# Patient Record
Sex: Female | Born: 1985 | Race: Black or African American | Hispanic: No | Marital: Single | Smoking: Former smoker
Health system: Southern US, Community
[De-identification: ages and names within clinical notes are randomized; demographics above are authoritative.]

## PROBLEM LIST (undated history)

## (undated) ENCOUNTER — Inpatient Hospital Stay (HOSPITAL_COMMUNITY): Payer: Self-pay

## (undated) DIAGNOSIS — L0291 Cutaneous abscess, unspecified: Secondary | ICD-10-CM

## (undated) DIAGNOSIS — D649 Anemia, unspecified: Secondary | ICD-10-CM

## (undated) DIAGNOSIS — O24419 Gestational diabetes mellitus in pregnancy, unspecified control: Secondary | ICD-10-CM

## (undated) DIAGNOSIS — I10 Essential (primary) hypertension: Secondary | ICD-10-CM

## (undated) HISTORY — DX: Gestational diabetes mellitus in pregnancy, unspecified control: O24.419

## (undated) HISTORY — DX: Essential (primary) hypertension: I10

## (undated) HISTORY — PX: CHOLECYSTECTOMY: SHX55

## (undated) HISTORY — DX: Anemia, unspecified: D64.9

## (undated) HISTORY — PX: NO PAST SURGERIES: SHX2092

---

## 2007-11-23 ENCOUNTER — Emergency Department (HOSPITAL_COMMUNITY): Admission: EM | Admit: 2007-11-23 | Discharge: 2007-11-23 | Payer: Self-pay | Admitting: Emergency Medicine

## 2009-11-16 ENCOUNTER — Ambulatory Visit (HOSPITAL_COMMUNITY): Admission: RE | Admit: 2009-11-16 | Discharge: 2009-11-16 | Payer: Self-pay | Admitting: Family Medicine

## 2009-12-09 ENCOUNTER — Ambulatory Visit (HOSPITAL_COMMUNITY): Admission: RE | Admit: 2009-12-09 | Discharge: 2009-12-09 | Payer: Self-pay | Admitting: Family Medicine

## 2010-03-14 ENCOUNTER — Ambulatory Visit (HOSPITAL_COMMUNITY): Admission: RE | Admit: 2010-03-14 | Discharge: 2010-03-14 | Payer: Self-pay | Admitting: Family Medicine

## 2010-03-25 ENCOUNTER — Inpatient Hospital Stay (HOSPITAL_COMMUNITY): Admission: AD | Admit: 2010-03-25 | Discharge: 2010-03-25 | Payer: Self-pay | Admitting: Obstetrics and Gynecology

## 2010-03-27 ENCOUNTER — Inpatient Hospital Stay (HOSPITAL_COMMUNITY): Admission: AD | Admit: 2010-03-27 | Discharge: 2010-03-29 | Payer: Self-pay | Admitting: Obstetrics and Gynecology

## 2010-03-27 ENCOUNTER — Ambulatory Visit: Payer: Self-pay | Admitting: Obstetrics and Gynecology

## 2010-08-23 ENCOUNTER — Emergency Department (HOSPITAL_COMMUNITY)
Admission: EM | Admit: 2010-08-23 | Discharge: 2010-08-24 | Disposition: A | Discharge status: Home / Self Care | Payer: Self-pay | Attending: Emergency Medicine | Admitting: Emergency Medicine

## 2010-08-23 DIAGNOSIS — R42 Dizziness and giddiness: Secondary | ICD-10-CM | POA: Insufficient documentation

## 2010-08-23 DIAGNOSIS — D649 Anemia, unspecified: Secondary | ICD-10-CM | POA: Insufficient documentation

## 2010-08-23 DIAGNOSIS — G5600 Carpal tunnel syndrome, unspecified upper limb: Secondary | ICD-10-CM | POA: Insufficient documentation

## 2010-08-24 LAB — URINE MICROSCOPIC-ADD ON

## 2010-08-24 LAB — POCT I-STAT, CHEM 8
Creatinine, Ser: 0.8 mg/dL (ref 0.4–1.2)
Hemoglobin: 11.6 g/dL — ABNORMAL LOW (ref 12.0–15.0)
Potassium: 3.7 mEq/L (ref 3.5–5.1)

## 2010-08-24 LAB — URINALYSIS, ROUTINE W REFLEX MICROSCOPIC
Ketones, ur: NEGATIVE mg/dL
Nitrite: NEGATIVE
Specific Gravity, Urine: 1.025 (ref 1.005–1.030)
Urobilinogen, UA: 1 mg/dL (ref 0.0–1.0)
pH: 5.5 (ref 5.0–8.0)

## 2010-08-24 LAB — POCT PREGNANCY, URINE: Preg Test, Ur: NEGATIVE

## 2010-08-25 LAB — URINE CULTURE
Colony Count: >=100000
Culture  Setup Time: 201202221038

## 2010-09-15 LAB — CBC
Hemoglobin: 10.6 g/dL — ABNORMAL LOW (ref 12.0–15.0)
MCH: 23.2 pg — ABNORMAL LOW (ref 26.0–34.0)
MCHC: 31.5 g/dL (ref 30.0–36.0)
MCV: 73.6 fL — ABNORMAL LOW (ref 78.0–100.0)

## 2011-03-29 LAB — URINALYSIS, ROUTINE W REFLEX MICROSCOPIC
Bilirubin Urine: NEGATIVE
Glucose, UA: NEGATIVE
Hgb urine dipstick: NEGATIVE
Ketones, ur: NEGATIVE
Nitrite: POSITIVE — AB
Protein, ur: NEGATIVE
Specific Gravity, Urine: 1.009
Urobilinogen, UA: 1
pH: 7.5

## 2011-03-29 LAB — DIFFERENTIAL
Basophils Absolute: 0
Basophils Relative: 0
Eosinophils Absolute: 0.1
Eosinophils Relative: 1
Lymphocytes Relative: 2 — ABNORMAL LOW
Lymphs Abs: 0.3 — ABNORMAL LOW
Monocytes Absolute: 0.8
Monocytes Relative: 7
Neutro Abs: 11.3 — ABNORMAL HIGH
Neutrophils Relative %: 91 — ABNORMAL HIGH

## 2011-03-29 LAB — URINE MICROSCOPIC-ADD ON

## 2011-03-29 LAB — POCT I-STAT, CHEM 8
Chloride: 103
Creatinine, Ser: 1.1
Glucose, Bld: 100 — ABNORMAL HIGH
HCT: 39
Hemoglobin: 13.3

## 2011-03-29 LAB — URINE CULTURE: Colony Count: >=100000

## 2011-03-29 LAB — CBC
HCT: 36
Hemoglobin: 11.4 — ABNORMAL LOW
MCHC: 31.8
MCV: 74.8 — ABNORMAL LOW
Platelets: 312
RBC: 4.82
RDW: 17.1 — ABNORMAL HIGH
WBC: 12.4 — ABNORMAL HIGH

## 2011-03-29 LAB — POCT PREGNANCY, URINE
Operator id: 151321
Preg Test, Ur: NEGATIVE

## 2011-03-29 LAB — RAPID STREP SCREEN (MED CTR MEBANE ONLY): Streptococcus, Group A Screen (Direct): NEGATIVE

## 2012-02-12 ENCOUNTER — Emergency Department (HOSPITAL_COMMUNITY)
Admission: EM | Admit: 2012-02-12 | Discharge: 2012-02-12 | Disposition: A | Discharge status: Home / Self Care | Payer: Self-pay | Attending: Emergency Medicine | Admitting: Emergency Medicine

## 2012-02-12 ENCOUNTER — Encounter (HOSPITAL_COMMUNITY): Payer: Self-pay | Admitting: Emergency Medicine

## 2012-02-12 DIAGNOSIS — L03317 Cellulitis of buttock: Secondary | ICD-10-CM | POA: Insufficient documentation

## 2012-02-12 DIAGNOSIS — L0231 Cutaneous abscess of buttock: Secondary | ICD-10-CM | POA: Insufficient documentation

## 2012-02-12 DIAGNOSIS — L0291 Cutaneous abscess, unspecified: Secondary | ICD-10-CM

## 2012-02-12 MED ORDER — HYDROCODONE-ACETAMINOPHEN 5-325 MG PO TABS
2.0000 | ORAL_TABLET | Freq: Once | ORAL | Status: AC
  Administered 2012-02-12: 2 via ORAL
  Filled 2012-02-12: qty 2

## 2012-02-12 MED ORDER — DOXYCYCLINE HYCLATE 100 MG PO CAPS: 100.0000 mg | ORAL_CAPSULE | Freq: Two times a day (BID) | ORAL | Status: AC

## 2012-02-12 MED ORDER — HYDROCODONE-ACETAMINOPHEN 5-500 MG PO TABS: 1.0000 | ORAL_TABLET | Freq: Four times a day (QID) | ORAL | Status: AC | PRN

## 2012-02-12 NOTE — ED Notes (Signed)
Pt c/o possible abscess to top of buttocks x several days that is getting more severe

## 2012-02-12 NOTE — ED Provider Notes (Signed)
History  This chart was scribed for Gabriella Roots, MD by Bennett Scrape. This patient was seen in room TR05C/TR05C and the patient's care was started at 10:30AM.  CSN: [id number]  Arrival date & time 02/12/12  1021   None     Chief Complaint  Patient presents with  . Abscess  . Insect Bite     Patient is a 26 y.o. female presenting with abscess. The history is provided by the patient. No language interpreter was used.  Abscess  This is a new problem. The current episode started less than one week ago. The problem occurs continuously. The problem has been gradually worsening. The abscess is present on the left buttock. The abscess is characterized by redness, swelling and itchiness. Pertinent negatives include no fever and no vomiting.    Gabriella [name] is a 26 y.o. female who presents to the Emergency Department complaining of 2 days of a possible abscess to top of the left buttocks that is getting more severe. She denies having any modifying factors and has not tried anything at home to improve her symptoms. She reports having one prior episode of similar symptoms several years ago but denies that this is a frequent issue. She denies fever, emesis, nausea and chills as associated symptoms. She does not have a h/o chronic medical conditions. She denies smoking and alcohol use.  History reviewed. No pertinent past medical history.  History reviewed. No pertinent past surgical history.  History reviewed. No pertinent family history.  History  Substance Use Topics  . Smoking status: Never Smoker   . Smokeless tobacco: Not on file  . Alcohol Use: No    No OB history provided.  Review of Systems  Constitutional: Negative for fever and chills.  Gastrointestinal: Negative for nausea and vomiting.  Skin: Negative for rash.       Positive for abscess to buttocks    Allergies  Review of patient's allergies indicates no known allergies.  Home Medications   Current  Outpatient Rx  Name Route Sig Dispense Refill  . IBUPROFEN 200 MG PO TABS Oral Take 400 mg by mouth every 6 (six) hours as needed. For pain.    . IRON PO Oral Take 1 tablet by mouth daily.      Triage Vitals: BP 136/69  Pulse 98  Temp 98.4 F (36.9 C) (Oral)  Resp 16  SpO2 100%  Physical Exam  Nursing note and vitals reviewed. Constitutional: She is oriented to person, place, and time. She appears well-developed and well-nourished. No distress.  HENT:  Head: Normocephalic and atraumatic.  Eyes: Conjunctivae are normal.  Neck: Neck supple. No tracheal deviation present.  Cardiovascular: Normal rate.   Pulmonary/Chest: Effort normal. No respiratory distress.  Musculoskeletal: Normal range of motion.  Neurological: She is alert and oriented to person, place, and time.  Skin: Skin is warm and dry.       Erythematous area on the left buttocks, approximately 5-6 cm diameter w induration?fluctuance.   Psychiatric: She has a normal mood and affect. Her behavior is normal.    ED Course  Procedures (including critical care time)  DIAGNOSTIC STUDIES: Oxygen Saturation is 100% on room air, normal by my interpretation.    COORDINATION OF CARE: 10:58AM-Discussed treatment plan of I&D with pt at bedside and pt agreed to plan.  INCISION AND DRAINAGE PROCEDURE NOTE: Patient identification was confirmed and verbal consent was obtained. This procedure was performed by me at 12:13PM. Site: top left buttock Sterile procedures observed  Needle size:  Anesthetic used (type and amt): 2 cc of 2% xylocaine Blade size: 15 Drainage: copious amount of purulent material Complexity: Complex Packing used Site anesthetized, incision made over site, wound drained and explored loculations, rinsed with copious amounts of normal saline, wound packed with sterile gauze, covered with dry, sterile dressing.  Pt tolerated procedure well without complications.  Instructions for care discussed verbally and  pt provided with additional written instructions for homecare and f/u.      MDM  I personally performed the services described in this documentation, which was scribed in my presence. The recorded information has been reviewed and considered. Gabriella Roots, MD   vicodin po (pt does not have to drive home). Good pain relief.  Pt comfortable post I and D. Sterile dressing. Will plan recheck 2 days. As surrounding erythema, will also rx abx.    Gabriella Roots, MD 02/12/12 [id number]

## 2012-02-14 ENCOUNTER — Encounter (HOSPITAL_COMMUNITY): Payer: Self-pay | Admitting: *Deleted

## 2012-02-14 ENCOUNTER — Emergency Department (HOSPITAL_COMMUNITY)
Admission: EM | Admit: 2012-02-14 | Discharge: 2012-02-14 | Disposition: A | Discharge status: Home / Self Care | Payer: Self-pay | Attending: Emergency Medicine | Admitting: Emergency Medicine

## 2012-02-14 DIAGNOSIS — L03317 Cellulitis of buttock: Secondary | ICD-10-CM | POA: Insufficient documentation

## 2012-02-14 DIAGNOSIS — L0231 Cutaneous abscess of buttock: Secondary | ICD-10-CM

## 2012-02-14 HISTORY — DX: Cutaneous abscess, unspecified: L02.91

## 2012-02-14 MED ORDER — SULFAMETHOXAZOLE-TRIMETHOPRIM 800-160 MG PO TABS: 1.0000 | ORAL_TABLET | Freq: Two times a day (BID) | ORAL | Status: AC

## 2012-02-14 MED ORDER — HYDROCODONE-ACETAMINOPHEN 5-325 MG PO TABS
2.0000 | ORAL_TABLET | Freq: Once | ORAL | Status: AC
  Administered 2012-02-14: 2 via ORAL
  Filled 2012-02-14: qty 2

## 2012-02-14 MED ORDER — PROMETHAZINE HCL 25 MG PO TABS: 25.0000 mg | ORAL_TABLET | Freq: Four times a day (QID) | ORAL | Status: DC | PRN

## 2012-02-14 NOTE — ED Provider Notes (Signed)
History   This chart was scribed for Gabriella [name]. Oletta Lamas, MD by Charolett Bumpers . The patient was seen in room TR04C/TR04C. Patient's care was started at 1210.    CSN: [id number]  Arrival date & time 02/14/12  1026   First MD Initiated Contact with Patient 02/14/12 1210      Chief Complaint  Patient presents with  . Wound Check    (Consider location/radiation/quality/duration/timing/severity/associated sxs/prior treatment) HPI Gabriella [name] is a 26 y.o. female who presents to the Emergency Department complaining of constant, moderate abscess located on her left buttock. Pt reports that she first noticed 4 days ago, was seen here in ED and had it drained 2 days ago. Pt reports that she was placed on abx and pain medication. Pt states that she is here for packing removal. Pt reports associated drainage and redness. Pt denies any fevers, but reports some intermittent chills. Pt reports that overall, her abscess has improved with the exception of some mild discomfort that is aggravated with certain positions. Pt reports a h/o abscess 6 years ago.   Past Medical History  Diagnosis Date  . Abscess     History reviewed. No pertinent past surgical history.  History reviewed. No pertinent family history.  History  Substance Use Topics  . Smoking status: Never Smoker   . Smokeless tobacco: Not on file  . Alcohol Use: No    OB History    Grav Para Term Preterm Abortions TAB SAB Ect Mult Living                  Review of Systems  Constitutional: Positive for chills. Negative for fever.  Respiratory: Negative for shortness of breath.   Gastrointestinal: Negative for nausea and vomiting.  Skin: Positive for wound.  Neurological: Negative for weakness.  All other systems reviewed and are negative.    Allergies  Doxycycline  Home Medications   Current Outpatient Rx  Name Route Sig Dispense Refill  . DOXYCYCLINE HYCLATE 100 MG PO CAPS Oral Take 1 capsule (100 mg total)  by mouth 2 (two) times daily. 14 capsule 0  . HYDROCODONE-ACETAMINOPHEN 5-500 MG PO TABS Oral Take 1-2 tablets by mouth every 6 (six) hours as needed for pain. 20 tablet 0  . IRON PO Oral Take 1 tablet by mouth daily.    Marland Kitchen PROMETHAZINE HCL 25 MG PO TABS Oral Take 1 tablet (25 mg total) by mouth every 6 (six) hours as needed for nausea. 20 tablet 0  . SULFAMETHOXAZOLE-TRIMETHOPRIM 800-160 MG PO TABS Oral Take 1 tablet by mouth 2 (two) times daily. 20 tablet 0    BP 135/53  Pulse 93  Temp 98.2 F (36.8 C) (Oral)  Resp 18  SpO2 100%  LMP 01/10/2012  Physical Exam  Nursing note and vitals reviewed. Constitutional: She is oriented to person, place, and time. She appears well-developed and well-nourished. No distress.  HENT:  Head: Normocephalic and atraumatic.  Eyes: EOM are normal.  Neck: Neck supple. No tracheal deviation present.  Cardiovascular: Normal rate.   Pulmonary/Chest: Effort normal. No respiratory distress.  Musculoskeletal: Normal range of motion.  Neurological: She is alert and oriented to person, place, and time.  Skin: Skin is warm and dry. There is erythema.       Palm sized area of erythema and induration on left buttock. Moderate purulent drainage noted. Minimal tenderness to area.   Psychiatric: She has a normal mood and affect. Her behavior is normal.    ED Course  Wound packing Date/Time: 02/14/2012 1:12 PM Performed by: Lear Ng. Authorized by: Lear Ng Consent: Verbal consent obtained. Written consent not obtained. Risks and benefits: risks, benefits and alternatives were discussed Consent given by: patient Patient understanding: patient states understanding of the procedure being performed Patient consent: the patient's understanding of the procedure matches consent given Patient identity confirmed: verbally with patient Time out: Immediately prior to procedure a "time out" was called to verify the correct patient, procedure, equipment,  support staff and site/side marked as required. Preparation: Patient was prepped and draped in the usual sterile fashion. Local anesthesia used: no Patient sedated: no Patient tolerance: Patient tolerated the procedure well with no immediate complications. Comments: Packed with 1/4 inch gauze after pus drainage, approximately 5 cc expressed with direct pressure from previous incision   (including critical care time)  DIAGNOSTIC STUDIES: Oxygen Saturation is 100% on room air, normal by my interpretation.    COORDINATION OF CARE:  12:32-Discussed planned course of treatment with the patient, who is agreeable at this time. Discussed f/u with a surgeon for further evaluation and treatment. Will repack the wound.    12:45-Medication Orders: Hydrocodone-acetaminophen (Norco/Vicodin) 5-325 mg per tablet 2 tablet-once.   1:04-Recheck: Repacked wound on left buttock with no immediate complications.   Labs Reviewed - No data to display No results found.   1. Abscess, gluteal, left       MDM  I personally performed the services described in this documentation, which was scribed in my presence. The recorded information has been reviewed and considered.   Due to the intolerance of the doxycycline, I changed to Bactrim.  Appearance of induration, erythema with deep abscess and continues to have purulent drainage, despite it feeling better to the patient, will refer to [facility] for re-evaluation.  Referral made and pt instructed to follow up with surgery clinic.         Gabriella [name]. Amberlin Utke, MD 02/14/12 1314

## 2012-02-14 NOTE — ED Notes (Signed)
Reports having wound I&D done on Monday to left buttock, needs packing removed. Only complaint is that one of her prescriptions are causing n/v.

## 2012-02-14 NOTE — Discharge Instructions (Signed)
 Abscess An abscess (boil or furuncle) is an infected area that contains a collection of pus.  SYMPTOMS Signs and symptoms of an abscess include pain, tenderness, redness, or hardness. You may feel a moveable soft area under your skin. An abscess can occur anywhere in the body.  TREATMENT  A surgical cut (incision) may be made over your abscess to drain the pus. Gauze may be packed into the space or a drain may be looped through the abscess cavity (pocket). This provides a drain that will allow the cavity to heal from the inside outwards. The abscess may be painful for a few days, but should feel much better if it was drained.  Your abscess, if seen early, may not have localized and may not have been drained. If not, another appointment may be required if it does not get better on its own or with medications. HOME CARE INSTRUCTIONS   Only take over-the-counter or prescription medicines for pain, discomfort, or fever as directed by your caregiver.   Take your antibiotics as directed if they were prescribed. Finish them even if you start to feel better.   Keep the skin and clothes clean around your abscess.   If the abscess was drained, you will need to use gauze dressing to collect any draining pus. Dressings will typically need to be changed 3 or more times a day.   The infection may spread by skin contact with others. Avoid skin contact as much as possible.   Practice good hygiene. This includes regular hand washing, cover any draining skin lesions, and do not share personal care items.   If you participate in sports, do not share athletic equipment, towels, whirlpools, or personal care items. Shower after every practice or tournament.   If a draining area cannot be adequately covered:   Do not participate in sports.   Children should not participate in day care until the wound has healed or drainage stops.   If your caregiver has given you a follow-up appointment, it is very important  to keep that appointment. Not keeping the appointment could result in a much worse infection, chronic or permanent injury, pain, and disability. If there is any problem keeping the appointment, you must call back to this facility for assistance.  SEEK MEDICAL CARE IF:   You develop increased pain, swelling, redness, drainage, or bleeding in the wound site.   You develop signs of generalized infection including muscle aches, chills, fever, or a general ill feeling.   You have an oral temperature above 102 F (38.9 C).  MAKE SURE YOU:   Understand these instructions.   Will watch your condition.   Will get help right away if you are not doing well or get worse.  Document Released: 03/29/2005 Document Revised: 06/08/2011 Document Reviewed: 01/21/2008 [facility] Patient Information 2012 [place], [place].     I recommend that you follow up in urgent surgical clinic associated with [facility] for re-evaluation of abscess.  It may require further surgical intervention for proper healing.

## 2012-02-16 ENCOUNTER — Ambulatory Visit (INDEPENDENT_AMBULATORY_CARE_PROVIDER_SITE_OTHER): Payer: Self-pay | Admitting: General Surgery

## 2012-02-16 ENCOUNTER — Encounter (INDEPENDENT_AMBULATORY_CARE_PROVIDER_SITE_OTHER): Payer: Self-pay | Admitting: General Surgery

## 2012-02-16 VITALS — BP 122/86 | HR 84 | Temp 97.4°F | Ht 67.0 in | Wt 281.2 lb

## 2012-02-16 DIAGNOSIS — L03317 Cellulitis of buttock: Secondary | ICD-10-CM

## 2012-02-16 DIAGNOSIS — L0231 Cutaneous abscess of buttock: Secondary | ICD-10-CM

## 2012-02-16 NOTE — Progress Notes (Signed)
Subjective:     Patient ID: Gabriella [name], female   DOB: [birth year], 26 y.o.   MRN: [id number]  HPI Patient was seen in the emergency department for a left superior buttock abscess. This was incised and drained this she presents for evaluation. It is been draining. The redness is gone away. It is feeling better. She is taking Bactrim.  Review of Systems     Objective:   Physical Exam  Cardiovascular: Normal rate.   Pulmonary/Chest: Effort normal. No respiratory distress. She has no wheezes.  Left superior buttock has a 1 cm incision drainage site. There is resolving induration. There is some purulent drainage remaining. The wound was cleaned out thoroughly. No undrained areas of pus are noted. Cellulitis appears to be significantly improved. Iodoform packing was placed.     Assessment:    Left buttock abscess    Plan:     Continue antibiotics, remove packing Sunday, return to urgent office 3 days

## 2012-02-19 ENCOUNTER — Encounter (INDEPENDENT_AMBULATORY_CARE_PROVIDER_SITE_OTHER): Payer: Self-pay | Admitting: Surgery

## 2012-02-19 ENCOUNTER — Ambulatory Visit (INDEPENDENT_AMBULATORY_CARE_PROVIDER_SITE_OTHER): Payer: Self-pay | Admitting: Surgery

## 2012-02-19 VITALS — BP 110/70 | HR 72 | Temp 98.5°F | Resp 18 | Ht 69.0 in | Wt 288.0 lb

## 2012-02-19 DIAGNOSIS — L0231 Cutaneous abscess of buttock: Secondary | ICD-10-CM | POA: Insufficient documentation

## 2012-02-19 NOTE — Progress Notes (Signed)
[facility]  Ovidio Kin, MD,  FACS [address] [place]  [address] [place], [place]    [address] Phone:  [phone] FAX:  [phone]   Re:   Gabriella [name] DOB:   [birth year] MRN:   [id number]  ASSESSMENT AND PLAN: 1.  Left buttocks abscess  I expanded the wound today.  To continue local wound care.  Finish Septra - about 4 more days  Return to see Dr. Janee Morn in 1 to 2 weeks.  I wrote her a note to return to work 02/21/2012.  2.  Morbid obesity.  HISTORY OF PRESENT ILLNESS: Chief Complaint  Patient presents with  . Wound Check    Gabriella [name] is a 26 y.o. (DOB: Nov 23, 1985)  AA female who is a patient of Provider Not In System and comes to me today for follow up of a left buttocks abscess.  She had this left buttocks abscess drained last week at the ER.  She went back a second time and they referred her to our office.  She was seen by Dr. Leonard Schwartz. Thompson on 02/16/2012.  He asked her to be seen back today.  She is doing well and having no pain.  PHYSICAL EXAM: BP 110/70  Pulse 72  Temp 98.5 F (36.9 C) (Oral)  Resp 18  Ht 5\' 9"  (1.753 m)  Wt 288 lb (130.636 kg)  BMI 42.53 kg/m2  LMP 01/10/2012  Buttocks:  Right buttocks wound with a 1 cm opening.  But when I probe this, the Q tip goes about 6 to 7 cm deep.  I think the abscess cavity is inadequately drain.  Procedure:  Wound painted with betadine, infiltrated with 1 % xylocaine, incision of 3 cm.  The wound is much better opened.  I packed it with gauze.  DATA REVIEWED: Old chart.  Ovidio Kin, MD, FACS Office:  [phone]

## 2012-02-29 ENCOUNTER — Encounter (INDEPENDENT_AMBULATORY_CARE_PROVIDER_SITE_OTHER): Payer: Self-pay | Admitting: General Surgery

## 2012-03-11 ENCOUNTER — Encounter (INDEPENDENT_AMBULATORY_CARE_PROVIDER_SITE_OTHER): Payer: Self-pay

## 2012-03-11 ENCOUNTER — Telehealth (INDEPENDENT_AMBULATORY_CARE_PROVIDER_SITE_OTHER): Payer: Self-pay

## 2012-03-11 NOTE — Telephone Encounter (Signed)
I left the pt a message to let her know her note is ready for work.  I will put it up front for pick up.

## 2012-03-11 NOTE — Telephone Encounter (Signed)
Message copied by Ivory Broad on Mon Mar 11, 2012  2:26 PM ------      Message from: Cathi Roan      Created: Mon Mar 11, 2012  1:51 PM      Regarding: Note for work      Contact: [phone]       Needs note for work stating that she was seen and under the care of Dr. Janee Morn

## 2015-09-07 ENCOUNTER — Encounter (HOSPITAL_COMMUNITY): Payer: Self-pay

## 2015-09-07 ENCOUNTER — Emergency Department (HOSPITAL_COMMUNITY): Payer: Self-pay

## 2015-09-07 ENCOUNTER — Emergency Department (HOSPITAL_COMMUNITY)
Admission: EM | Admit: 2015-09-07 | Discharge: 2015-09-07 | Disposition: A | Discharge status: Home / Self Care | Payer: Self-pay | Attending: Emergency Medicine | Admitting: Emergency Medicine

## 2015-09-07 DIAGNOSIS — Z79899 Other long term (current) drug therapy: Secondary | ICD-10-CM | POA: Insufficient documentation

## 2015-09-07 DIAGNOSIS — Z3202 Encounter for pregnancy test, result negative: Secondary | ICD-10-CM | POA: Insufficient documentation

## 2015-09-07 DIAGNOSIS — N390 Urinary tract infection, site not specified: Secondary | ICD-10-CM

## 2015-09-07 DIAGNOSIS — R55 Syncope and collapse: Secondary | ICD-10-CM | POA: Insufficient documentation

## 2015-09-07 DIAGNOSIS — A599 Trichomoniasis, unspecified: Secondary | ICD-10-CM

## 2015-09-07 DIAGNOSIS — Z792 Long term (current) use of antibiotics: Secondary | ICD-10-CM | POA: Insufficient documentation

## 2015-09-07 DIAGNOSIS — A5901 Trichomonal vulvovaginitis: Secondary | ICD-10-CM | POA: Insufficient documentation

## 2015-09-07 DIAGNOSIS — B9689 Other specified bacterial agents as the cause of diseases classified elsewhere: Secondary | ICD-10-CM

## 2015-09-07 DIAGNOSIS — N760 Acute vaginitis: Secondary | ICD-10-CM | POA: Insufficient documentation

## 2015-09-07 DIAGNOSIS — D649 Anemia, unspecified: Secondary | ICD-10-CM | POA: Insufficient documentation

## 2015-09-07 LAB — CBC
HEMATOCRIT: 34.7 % — AB (ref 36.0–46.0)
HEMOGLOBIN: 10.3 g/dL — AB (ref 12.0–15.0)
MCH: 21.5 pg — AB (ref 26.0–34.0)
MCHC: 29.7 g/dL — AB (ref 30.0–36.0)
MCV: 72.6 fL — ABNORMAL LOW (ref 78.0–100.0)
Platelets: 337 10*3/uL (ref 150–400)
RBC: 4.78 MIL/uL (ref 3.87–5.11)
RDW: 17.6 % — ABNORMAL HIGH (ref 11.5–15.5)
WBC: 7.3 10*3/uL (ref 4.0–10.5)

## 2015-09-07 LAB — URINE MICROSCOPIC-ADD ON

## 2015-09-07 LAB — WET PREP, GENITAL
Sperm: NONE SEEN
Yeast Wet Prep HPF POC: NONE SEEN

## 2015-09-07 LAB — BASIC METABOLIC PANEL
ANION GAP: 10 (ref 5–15)
BUN: 10 mg/dL (ref 6–20)
CO2: 27 mmol/L (ref 22–32)
Calcium: 9.1 mg/dL (ref 8.9–10.3)
Chloride: 101 mmol/L (ref 101–111)
Creatinine, Ser: 0.79 mg/dL (ref 0.44–1.00)
GFR calc Af Amer: >60 mL/min (ref >60)
GFR calc non Af Amer: >60 mL/min (ref >60)
GLUCOSE: 105 mg/dL — AB (ref 65–99)
POTASSIUM: 3.5 mmol/L (ref 3.5–5.1)
Sodium: 138 mmol/L (ref 135–145)

## 2015-09-07 LAB — URINALYSIS, ROUTINE W REFLEX MICROSCOPIC
BILIRUBIN URINE: NEGATIVE
Glucose, UA: NEGATIVE mg/dL
Ketones, ur: NEGATIVE mg/dL
NITRITE: NEGATIVE
PH: 6 (ref 5.0–8.0)
Protein, ur: NEGATIVE mg/dL
SPECIFIC GRAVITY, URINE: 1.011 (ref 1.005–1.030)

## 2015-09-07 LAB — I-STAT BETA HCG BLOOD, ED (MC, WL, AP ONLY): I-stat hCG, quantitative: <5 m[IU]/mL (ref <5)

## 2015-09-07 MED ORDER — PROMETHAZINE HCL 25 MG PO TABS
25.0000 mg | ORAL_TABLET | Freq: Four times a day (QID) | ORAL | Status: DC | PRN
Start: 2015-09-07 — End: 2015-12-27

## 2015-09-07 MED ORDER — IBUPROFEN 200 MG PO TABS
400.0000 mg | ORAL_TABLET | Freq: Once | ORAL | Status: AC
  Administered 2015-09-07: 400 mg via ORAL
  Filled 2015-09-07: qty 2

## 2015-09-07 MED ORDER — CEPHALEXIN 500 MG PO CAPS: 500.0000 mg | ORAL_CAPSULE | Freq: Two times a day (BID) | ORAL | Status: DC

## 2015-09-07 MED ORDER — METRONIDAZOLE 500 MG PO TABS: 500.0000 mg | ORAL_TABLET | Freq: Two times a day (BID) | ORAL | Status: DC

## 2015-09-07 NOTE — ED Notes (Signed)
Patient transported to X-ray 

## 2015-09-07 NOTE — ED Notes (Signed)
Discharge instructions, follow up care, and rx x3 reviewed with patient. Patient verbalized understanding. 

## 2015-09-07 NOTE — ED Notes (Signed)
Bed: WA09 Expected date:  Expected time:  Means of arrival:  Comments: Hold for hall B per MD

## 2015-09-07 NOTE — ED Notes (Signed)
Pt ambulated to XRay

## 2015-09-07 NOTE — ED Provider Notes (Signed)
CSN: [id number]     Arrival date & time 09/07/15  1321 History   First MD Initiated Contact with Patient 09/07/15 2129     Chief Complaint  Patient presents with  . Near Syncope      Patient is a 30 y.o. female presenting with near-syncope. The history is provided by the patient. No language interpreter was used.  Near Syncope   Gabriella [name] is a 30 y.o. female who presents to the Emergency Department complaining of near syncope.  She has a history of anemia due to heavy cycles and takes over-the-counter iron. Today she woke up with body aches, nausea, malaise. She has a little congestion. She reports subjective fever today. No sore throat, chest pain, vomiting, diarrhea. Symptoms are moderate and constant nature. She does have some vaginal discharge.    Past Medical History  Diagnosis Date  . Abscess   . Anemia    History reviewed. No pertinent past surgical history. History reviewed. No pertinent family history. Social History  Substance Use Topics  . Smoking status: Never Smoker   . Smokeless tobacco: None  . Alcohol Use: No   OB History    No data available     Review of Systems  Cardiovascular: Positive for near-syncope.  All other systems reviewed and are negative.     Allergies  Doxycycline  Home Medications   Prior to Admission medications   Medication Sig Start Date End Date Taking? Authorizing Provider  ibuprofen (ADVIL,MOTRIN) 200 MG tablet Take 400 mg by mouth every 6 (six) hours as needed for moderate pain.   Yes Historical Provider, MD  IRON PO Take 1 tablet by mouth daily.   Yes Historical Provider, MD  cephALEXin (KEFLEX) 500 MG capsule Take 1 capsule (500 mg total) by mouth 2 (two) times daily. 09/07/15   Tilden Fossa, MD  metroNIDAZOLE (FLAGYL) 500 MG tablet Take 1 tablet (500 mg total) by mouth 2 (two) times daily. 09/07/15   Tilden Fossa, MD  promethazine (PHENERGAN) 25 MG tablet Take 1 tablet (25 mg total) by mouth every 6 (six) hours as  needed for nausea or vomiting. 09/07/15   Tilden Fossa, MD   BP 122/58 mmHg  Pulse 87  Temp(Src) 100.3 F (37.9 C) (Oral)  Resp 12  SpO2 100%  LMP 09/04/2015 Physical Exam  Constitutional: She is oriented to person, place, and time. She appears well-developed and well-nourished.  HENT:  Head: Normocephalic and atraumatic.  Cardiovascular: Regular rhythm.   No murmur heard. Tachycardic  Pulmonary/Chest: Effort normal and breath sounds normal. No respiratory distress.  Abdominal: Soft. There is no tenderness. There is no rebound and no guarding.  Genitourinary:  Moderate yellow vaginal discharge with cervical friability.  No CMT or adnexal tenderness.    Musculoskeletal: She exhibits no edema or tenderness.  Neurological: She is alert and oriented to person, place, and time.  Skin: Skin is warm and dry.  Psychiatric: She has a normal mood and affect. Her behavior is normal.  Nursing note and vitals reviewed.   ED Course  Procedures (including critical care time) Labs Review Labs Reviewed  WET PREP, GENITAL - Abnormal; Notable for the following:    Trich, Wet Prep PRESENT (*)    Clue Cells Wet Prep HPF POC PRESENT (*)    WBC, Wet Prep HPF POC MANY (*)    All other components within normal limits  BASIC METABOLIC PANEL - Abnormal; Notable for the following:    Glucose, Bld 105 (*)  All other components within normal limits  CBC - Abnormal; Notable for the following:    Hemoglobin 10.3 (*)    HCT 34.7 (*)    MCV 72.6 (*)    MCH 21.5 (*)    MCHC 29.7 (*)    RDW 17.6 (*)    All other components within normal limits  URINALYSIS, ROUTINE W REFLEX MICROSCOPIC (NOT AT [facility]) - Abnormal; Notable for the following:    APPearance CLOUDY (*)    Hgb urine dipstick MODERATE (*)    Leukocytes, UA MODERATE (*)    All other components within normal limits  URINE MICROSCOPIC-ADD ON - Abnormal; Notable for the following:    Squamous Epithelial / LPF 0-5 (*)    Bacteria, UA FEW (*)     All other components within normal limits  CBG MONITORING, ED  I-STAT BETA HCG BLOOD, ED ([facility], [facility], [facility] ONLY)  GC/CHLAMYDIA PROBE AMP ([place]) NOT AT [facility]    Imaging Review Dg Chest 2 View  09/07/2015  CLINICAL DATA:  Cough and fever EXAM: CHEST  2 VIEW COMPARISON:  None. FINDINGS: The heart size and mediastinal contours are within normal limits. Both lungs are clear. The visualized skeletal structures are unremarkable. IMPRESSION: No active cardiopulmonary disease. Electronically Signed   By: Marlan Palauharles  Clark M.D.   On: 09/07/2015 22:11   I have personally reviewed and evaluated these images and lab results as part of my medical decision-making.   EKG Interpretation   Date/Time:  Tuesday September 07 2015 14:01:34 EST Ventricular Rate:  110 PR Interval:  164 QRS Duration: 91 QT Interval:  318 QTC Calculation: 430 R Axis:   46 Text Interpretation:  Sinus tachycardia No significant change since last  tracing Confirmed by [facility] MD, Barbara CowerJASON [phone]([id number]) on 09/07/2015 2:06:50 PM      MDM   Final diagnoses:  Trichomonas vaginalis infection  Acute UTI (urinary tract infection)  BV (bacterial vaginosis)    Patient here for evaluation of body aches, near-syncope, nausea, dysuria, vaginal discharge. He is concerning for UTI, pelvic exam with vaginal discharge but no evidence of PID. Will treat for UTI, BV, Trichomonas. Discussed wound care with oral fluid hydration, outpatient follow-up, return precautions.    Tilden FossaElizabeth Taber Sweetser, MD 09/07/15 [phone]

## 2015-09-07 NOTE — Discharge Instructions (Signed)
Bacterial Vaginosis °Bacterial vaginosis is a vaginal infection that occurs when the normal balance of bacteria in the vagina is disrupted. It results from an overgrowth of certain bacteria. This is the most common vaginal infection in women of childbearing age. Treatment is important to prevent complications, especially in pregnant women, as it can cause a premature delivery. °CAUSES  °Bacterial vaginosis is caused by an increase in harmful bacteria that are normally present in smaller amounts in the vagina. Several different kinds of bacteria can cause bacterial vaginosis. However, the reason that the condition develops is not fully understood. °RISK FACTORS °Certain activities or behaviors can put you at an increased risk of developing bacterial vaginosis, including: °· Having a new sex partner or multiple sex partners. °· Douching. °· Using an intrauterine device (IUD) for contraception. °Women do not get bacterial vaginosis from toilet seats, bedding, swimming pools, or contact with objects around them. °SIGNS AND SYMPTOMS  °Some women with bacterial vaginosis have no signs or symptoms. Common symptoms include: °· Grey vaginal discharge. °· A fishlike odor with discharge, especially after sexual intercourse. °· Itching or burning of the vagina and vulva. °· Burning or pain with urination. °DIAGNOSIS  °Your health care provider will take a medical history and examine the vagina for signs of bacterial vaginosis. A sample of vaginal fluid may be taken. Your health care provider will look at this sample under a microscope to check for bacteria and abnormal cells. A vaginal pH test may also be done.  °TREATMENT  °Bacterial vaginosis may be treated with antibiotic medicines. These may be given in the form of a pill or a vaginal cream. A second round of antibiotics may be prescribed if the condition comes back after treatment. Because bacterial vaginosis increases your risk for sexually transmitted diseases, getting  treated can help reduce your risk for chlamydia, gonorrhea, HIV, and herpes. °HOME CARE INSTRUCTIONS  °· Only take over-the-counter or prescription medicines as directed by your health care provider. °· If antibiotic medicine was prescribed, take it as directed. Make sure you finish it even if you start to feel better. °· Tell all sexual partners that you have a vaginal infection. They should see their health care provider and be treated if they have problems, such as a mild rash or itching. °· During treatment, it is important that you follow these instructions: °· Avoid sexual activity or use condoms correctly. °· Do not douche. °· Avoid alcohol as directed by your health care provider. °· Avoid breastfeeding as directed by your health care provider. °SEEK MEDICAL CARE IF:  °· Your symptoms are not improving after 3 days of treatment. °· You have increased discharge or pain. °· You have a fever. °MAKE SURE YOU:  °· Understand these instructions. °· Will watch your condition. °· Will get help right away if you are not doing well or get worse. °FOR MORE INFORMATION  °Centers for Disease Control and Prevention, Division of STD Prevention: www.cdc.gov/std °American Sexual Health Association (ASHA): www.ashastd.org  °  °This information is not intended to replace advice given to you by your health care provider. Make sure you discuss any questions you have with your health care provider. °  °Document Released: 06/19/2005 Document Revised: 07/10/2014 Document Reviewed: 01/29/2013 °Elsevier Interactive Patient Education ©2016 Elsevier Inc. ° °Trichomoniasis °Trichomoniasis is an infection caused by an organism called Trichomonas. The infection can affect both women and men. In women, the outer female genitalia and the vagina are affected. In men, the penis is mainly   mainly affected, but the prostate and other reproductive organs can also be involved. Trichomoniasis is a sexually transmitted infection (STI) and is most often  passed to another person through sexual contact.  RISK FACTORS  Having unprotected sexual intercourse.  Having sexual intercourse with an infected partner. SIGNS AND SYMPTOMS  Symptoms of trichomoniasis in women include:  Abnormal gray-green frothy vaginal discharge.  Itching and irritation of the vagina.  Itching and irritation of the area outside the vagina. Symptoms of trichomoniasis in men include:   Penile discharge with or without pain.  Pain during urination. This results from inflammation of the urethra. DIAGNOSIS  Trichomoniasis may be found during a Pap test or physical exam. Your health care provider may use one of the following methods to help diagnose this infection:  Testing the pH of the vagina with a test tape.  Using a vaginal swab test that checks for the Trichomonas organism. A test is available that provides results within a few minutes.  Examining a urine sample.  Testing vaginal secretions. Your health care provider may test you for other STIs, including HIV. TREATMENT   You may be given medicine to fight the infection. Women should inform their health care provider if they could be or are pregnant. Some medicines used to treat the infection should not be taken during pregnancy.  Your health care provider may recommend over-the-counter medicines or creams to decrease itching or irritation.  Your sexual partner will need to be treated if infected.  Your health care provider may test you for infection again 3 months after treatment. HOME CARE INSTRUCTIONS   Take medicines only as directed by your health care provider.  Take over-the-counter medicine for itching or irritation as directed by your health care provider.  Do not have sexual intercourse while you have the infection.  Women should not douche or wear tampons while they have the infection.  Discuss your infection with your partner. Your partner may have gotten the infection from you, or you  may have gotten it from your partner.  Have your sex partner get examined and treated if necessary.  Practice safe, informed, and protected sex.  See your health care provider for other STI testing. SEEK MEDICAL CARE IF:   You still have symptoms after you finish your medicine.  You develop abdominal pain.  You have pain when you urinate.  You have bleeding after sexual intercourse.  You develop a rash.  Your medicine makes you sick or makes you throw up (vomit). MAKE SURE YOU:  Understand these instructions.  Will watch your condition.  Will get help right away if you are not doing well or get worse.   This information is not intended to replace advice given to you by your health care provider. Make sure you discuss any questions you have with your health care provider.   Document Released: 12/13/2000 Document Revised: 07/10/2014 Document Reviewed: 03/31/2013 Elsevier Interactive Patient Education 2016 [employer] Inc.  Urinary Tract Infection Urinary tract infections (UTIs) can develop anywhere along your urinary tract. Your urinary tract is your body's drainage system for removing wastes and extra water. Your urinary tract includes two kidneys, two ureters, a bladder, and a urethra. Your kidneys are a pair of bean-shaped organs. Each kidney is about the size of your fist. They are located below your ribs, one on each side of your spine. CAUSES Infections are caused by microbes, which are microscopic organisms, including fungi, viruses, and bacteria. These organisms are so small that they  only be seen through a microscope. Bacteria are the microbes that most commonly cause UTIs. °SYMPTOMS  °Symptoms of UTIs may vary by age and gender of the patient and by the location of the infection. Symptoms in young women typically include a frequent and intense urge to urinate and a painful, burning feeling in the bladder or urethra during urination. Older women and men are more likely to  be tired, shaky, and weak and have muscle aches and abdominal pain. A fever may mean the infection is in your kidneys. Other symptoms of a kidney infection include pain in your back or sides below the ribs, nausea, and vomiting. °DIAGNOSIS °To diagnose a UTI, your caregiver will ask you about your symptoms. Your caregiver will also ask you to provide a urine sample. The urine sample will be tested for bacteria and white blood cells. White blood cells are made by your body to help fight infection. °TREATMENT  °Typically, UTIs can be treated with medication. Because most UTIs are caused by a bacterial infection, they usually can be treated with the use of antibiotics. The choice of antibiotic and length of treatment depend on your symptoms and the type of bacteria causing your infection. °HOME CARE INSTRUCTIONS °· If you were prescribed antibiotics, take them exactly as your caregiver instructs you. Finish the medication even if you feel better after you have only taken some of the medication. °· Drink enough water and fluids to keep your urine clear or pale yellow. °· Avoid caffeine, tea, and carbonated beverages. They tend to irritate your bladder. °· Empty your bladder often. Avoid holding urine for long periods of time. °· Empty your bladder before and after sexual intercourse. °· After a bowel movement, women should cleanse from front to back. Use each tissue only once. °SEEK MEDICAL CARE IF:  °· You have back pain. °· You develop a fever. °· Your symptoms do not begin to resolve within 3 days. °SEEK IMMEDIATE MEDICAL CARE IF:  °· You have severe back pain or lower abdominal pain. °· You develop chills. °· You have nausea or vomiting. °· You have continued burning or discomfort with urination. °MAKE SURE YOU:  °· Understand these instructions. °· Will watch your condition. °· Will get help right away if you are not doing well or get worse. °  °This information is not intended to replace advice given to you by  your health care provider. Make sure you discuss any questions you have with your health care provider. °  °Document Released: 03/29/2005 Document Revised: 03/10/2015 Document Reviewed: 07/28/2011 °Elsevier Interactive Patient Education ©2016 Elsevier Inc. ° °

## 2015-09-07 NOTE — ED Notes (Signed)
Pt has hx of anemia.  Pt has had heavy cycle for 3 days.  Felt weak and had to sit down in floor.  Pt able to get back up and get someone to bring her here.  Pt also has headache.

## 2015-09-09 LAB — GC/CHLAMYDIA PROBE AMP (~~LOC~~) NOT AT ARMC
CHLAMYDIA, DNA PROBE: NEGATIVE
NEISSERIA GONORRHEA: NEGATIVE

## 2015-12-27 ENCOUNTER — Emergency Department (HOSPITAL_COMMUNITY): Payer: Self-pay

## 2015-12-27 ENCOUNTER — Encounter (HOSPITAL_COMMUNITY): Payer: Self-pay | Admitting: Emergency Medicine

## 2015-12-27 ENCOUNTER — Emergency Department (HOSPITAL_COMMUNITY)
Admission: EM | Admit: 2015-12-27 | Discharge: 2015-12-27 | Disposition: A | Discharge status: Home / Self Care | Payer: Self-pay | Attending: Emergency Medicine | Admitting: Emergency Medicine

## 2015-12-27 DIAGNOSIS — R109 Unspecified abdominal pain: Secondary | ICD-10-CM

## 2015-12-27 DIAGNOSIS — K8050 Calculus of bile duct without cholangitis or cholecystitis without obstruction: Secondary | ICD-10-CM | POA: Insufficient documentation

## 2015-12-27 DIAGNOSIS — N390 Urinary tract infection, site not specified: Secondary | ICD-10-CM | POA: Insufficient documentation

## 2015-12-27 LAB — COMPREHENSIVE METABOLIC PANEL
ALK PHOS: 85 U/L (ref 38–126)
ALT: 16 U/L (ref 14–54)
AST: 16 U/L (ref 15–41)
Albumin: 4.3 g/dL (ref 3.5–5.0)
Anion gap: 9 (ref 5–15)
BILIRUBIN TOTAL: 1 mg/dL (ref 0.3–1.2)
BUN: 8 mg/dL (ref 6–20)
CO2: 25 mmol/L (ref 22–32)
CREATININE: 0.85 mg/dL (ref 0.44–1.00)
Calcium: 9 mg/dL (ref 8.9–10.3)
Chloride: 103 mmol/L (ref 101–111)
GFR calc Af Amer: >60 mL/min (ref >60)
Glucose, Bld: 110 mg/dL — ABNORMAL HIGH (ref 65–99)
Potassium: 3.6 mmol/L (ref 3.5–5.1)
Sodium: 137 mmol/L (ref 135–145)
TOTAL PROTEIN: 8.6 g/dL — AB (ref 6.5–8.1)

## 2015-12-27 LAB — CBC
HCT: 36.3 % (ref 36.0–46.0)
Hemoglobin: 11.1 g/dL — ABNORMAL LOW (ref 12.0–15.0)
MCH: 21.8 pg — ABNORMAL LOW (ref 26.0–34.0)
MCHC: 30.6 g/dL (ref 30.0–36.0)
MCV: 71.2 fL — ABNORMAL LOW (ref 78.0–100.0)
PLATELETS: 364 10*3/uL (ref 150–400)
RBC: 5.1 MIL/uL (ref 3.87–5.11)
RDW: 16.7 % — AB (ref 11.5–15.5)
WBC: 9.9 10*3/uL (ref 4.0–10.5)

## 2015-12-27 LAB — URINALYSIS, ROUTINE W REFLEX MICROSCOPIC
BILIRUBIN URINE: NEGATIVE
GLUCOSE, UA: NEGATIVE mg/dL
Hgb urine dipstick: NEGATIVE
KETONES UR: NEGATIVE mg/dL
NITRITE: NEGATIVE
PROTEIN: NEGATIVE mg/dL
Specific Gravity, Urine: 1.025 (ref 1.005–1.030)
pH: 5.5 (ref 5.0–8.0)

## 2015-12-27 LAB — I-STAT BETA HCG BLOOD, ED (MC, WL, AP ONLY): I-stat hCG, quantitative: <5 m[IU]/mL (ref <5)

## 2015-12-27 LAB — URINE MICROSCOPIC-ADD ON: RBC / HPF: NONE SEEN RBC/hpf (ref 0–5)

## 2015-12-27 LAB — LIPASE, BLOOD: Lipase: 23 U/L (ref 11–51)

## 2015-12-27 MED ORDER — RANITIDINE HCL 150 MG PO TABS: 150.0000 mg | ORAL_TABLET | Freq: Two times a day (BID) | ORAL | Status: DC

## 2015-12-27 MED ORDER — ONDANSETRON HCL 4 MG/2ML IJ SOLN
4.0000 mg | Freq: Once | INTRAMUSCULAR | Status: AC | PRN
  Administered 2015-12-27: 4 mg via INTRAVENOUS
  Filled 2015-12-27: qty 2

## 2015-12-27 MED ORDER — SODIUM CHLORIDE 0.9 % IV BOLUS (SEPSIS)
1000.0000 mL | Freq: Once | INTRAVENOUS | Status: AC
  Administered 2015-12-27: 1000 mL via INTRAVENOUS

## 2015-12-27 MED ORDER — DICYCLOMINE HCL 10 MG PO CAPS
10.0000 mg | ORAL_CAPSULE | Freq: Once | ORAL | Status: AC
  Administered 2015-12-27: 10 mg via ORAL
  Filled 2015-12-27: qty 1

## 2015-12-27 MED ORDER — CEPHALEXIN 500 MG PO CAPS
500.0000 mg | ORAL_CAPSULE | Freq: Once | ORAL | Status: AC
Start: 2015-12-27 — End: 2015-12-27
  Administered 2015-12-27: 500 mg via ORAL
  Filled 2015-12-27: qty 1

## 2015-12-27 MED ORDER — GI COCKTAIL ~~LOC~~
30.0000 mL | Freq: Once | ORAL | Status: AC
  Administered 2015-12-27: 30 mL via ORAL
  Filled 2015-12-27: qty 30

## 2015-12-27 MED ORDER — CEPHALEXIN 500 MG PO CAPS: 500.0000 mg | ORAL_CAPSULE | Freq: Two times a day (BID) | ORAL | Status: DC

## 2015-12-27 MED ORDER — PROMETHAZINE HCL 25 MG PO TABS: 25.0000 mg | ORAL_TABLET | Freq: Four times a day (QID) | ORAL | Status: DC | PRN

## 2015-12-27 MED ORDER — DICYCLOMINE HCL 20 MG PO TABS: 20.0000 mg | ORAL_TABLET | Freq: Two times a day (BID) | ORAL | Status: DC

## 2015-12-27 NOTE — ED Notes (Signed)
Discharge instructions, follow up care, and prescriptions reviewed with patient. Patient verbalized understanding. 

## 2015-12-27 NOTE — Discharge Instructions (Signed)
Cholelithiasis °Cholelithiasis (also called gallstones) is a form of gallbladder disease in which gallstones form in your gallbladder. The gallbladder is an organ that stores bile made in the liver, which helps digest fats. Gallstones begin as small crystals and slowly grow into stones. Gallstone pain occurs when the gallbladder spasms and a gallstone is blocking the duct. Pain can also occur when a stone passes out of the duct.  °RISK FACTORS °· Being female.   °· Having multiple pregnancies. Health care providers sometimes advise removing diseased gallbladders before future pregnancies.   °· Being obese. °· Eating a diet heavy in fried foods and fat.   °· Being older than 60 years and increasing age.   °· Prolonged use of medicines containing female hormones.   °· Having diabetes mellitus.   °· Rapidly losing weight.   °· Having a family history of gallstones (heredity).   °SYMPTOMS °· Nausea.   °· Vomiting. °· Abdominal pain.   °· Yellowing of the skin (jaundice).   °· Sudden pain. It may persist from several minutes to several hours. °· Fever.   °· Tenderness to the touch.  °In some cases, when gallstones do not move into the bile duct, people have no pain or symptoms. These are called "silent" gallstones.  °TREATMENT °Silent gallstones do not need treatment. In severe cases, emergency surgery may be required. Options for treatment include: °· Surgery to remove the gallbladder. This is the most common treatment. °· Medicines. These do not always work and may take 6-12 months or more to work. °· Shock wave treatment (extracorporeal biliary lithotripsy). In this treatment an ultrasound machine sends shock waves to the gallbladder to break gallstones into smaller pieces that can pass into the intestines or be dissolved by medicine. °HOME CARE INSTRUCTIONS  °· Only take over-the-counter or prescription medicines for pain, discomfort, or fever as directed by your health care provider.   °· Follow a low-fat diet until  seen again by your health care provider. Fat causes the gallbladder to contract, which can result in pain.   °· Follow up with your health care provider as directed. Attacks are almost always recurrent and surgery is usually required for permanent treatment.   °SEEK IMMEDIATE MEDICAL CARE IF:  °· Your pain increases and is not controlled by medicines.   °· You have a fever or persistent symptoms for more than 2-3 days.   °· You have a fever and your symptoms suddenly get worse.   °· You have persistent nausea and vomiting.   °MAKE SURE YOU:  °· Understand these instructions. °· Will watch your condition. °· Will get help right away if you are not doing well or get worse. °  °This information is not intended to replace advice given to you by your health care provider. Make sure you discuss any questions you have with your health care provider. °  °Document Released: 06/15/2005 Document Revised: 02/19/2013 Document Reviewed: 12/11/2012 °Elsevier Interactive Patient Education ©2016 Else[employer] Inc. ° °Urinary Tract Infection °Urinary tract infections (UTIs) can develop anywhere along your urinary tract. Your urinary tract is your body's drainage system for removing wastes and extra water. Your urinary tract includes two kidneys, two ureters, a bladder, and a urethra. Your kidneys are a pair of bean-shaped organs. Each kidney is about the size of your fist. They are located below your ribs, one on each side of your spine. °CAUSES °Infections are caused by microbes, which are microscopic organisms, including fungi, viruses, and bacteria. These organisms are so small that they can only be seen through a microscope. Bacteria are the microbes that most commonly cause   UTIs. °SYMPTOMS  °Symptoms of UTIs may vary by age and gender of the patient and by the location of the infection. Symptoms in young women typically include a frequent and intense urge to urinate and a painful, burning feeling in the bladder or urethra during  urination. Older women and men are more likely to be tired, shaky, and weak and have muscle aches and abdominal pain. A fever may mean the infection is in your kidneys. Other symptoms of a kidney infection include pain in your back or sides below the ribs, nausea, and vomiting. °DIAGNOSIS °To diagnose a UTI, your caregiver will ask you about your symptoms. Your caregiver will also ask you to provide a urine sample. The urine sample will be tested for bacteria and white blood cells. White blood cells are made by your body to help fight infection. °TREATMENT  °Typically, UTIs can be treated with medication. Because most UTIs are caused by a bacterial infection, they usually can be treated with the use of antibiotics. The choice of antibiotic and length of treatment depend on your symptoms and the type of bacteria causing your infection. °HOME CARE INSTRUCTIONS °· If you were prescribed antibiotics, take them exactly as your caregiver instructs you. Finish the medication even if you feel better after you have only taken some of the medication. °· Drink enough water and fluids to keep your urine clear or pale yellow. °· Avoid caffeine, tea, and carbonated beverages. They tend to irritate your bladder. °· Empty your bladder often. Avoid holding urine for long periods of time. °· Empty your bladder before and after sexual intercourse. °· After a bowel movement, women should cleanse from front to back. Use each tissue only once. °SEEK MEDICAL CARE IF:  °· You have back pain. °· You develop a fever. °· Your symptoms do not begin to resolve within 3 days. °SEEK IMMEDIATE MEDICAL CARE IF:  °· You have severe back pain or lower abdominal pain. °· You develop chills. °· You have nausea or vomiting. °· You have continued burning or discomfort with urination. °MAKE SURE YOU:  °· Understand these instructions. °· Will watch your condition. °· Will get help right away if you are not doing well or get worse. °  °This information is  not intended to replace advice given to you by your health care provider. Make sure you discuss any questions you have with your health care provider. °  °Document Released: 03/29/2005 Document Revised: 03/10/2015 Document Reviewed: 07/28/2011 °Elsevier Interactive Patient Education ©2016 Elsevier Inc. ° °

## 2015-12-27 NOTE — ED Provider Notes (Signed)
CSN: [id number]     Arrival date & time 12/27/15  [id number] History   First MD Initiated Contact with Patient 12/27/15 0802     Chief Complaint  Patient presents with  . Abdominal Pain     Patient is a 30 y.o. female presenting with abdominal pain. The history is provided by the patient. No language interpreter was used.  Abdominal Pain  Gabriella [name] is a 30 y.o. female who presents to the Emergency Department complaining of abdominal pain. She reports 1 week of right-sided abdominal pain described as a discomfort. She had significant vomiting over a week ago and now has persistent nausea, decreased appetite, reflux symptoms. She has abdominal bloating. No fevers. She was constipated a week ago but this has since resolved. No diarrhea, dysuria, discharge. Symptoms are moderate, constant, worsening.  Past Medical History  Diagnosis Date  . Abscess   . Anemia    History reviewed. No pertinent past surgical history. History reviewed. No pertinent family history. Social History  Substance Use Topics  . Smoking status: Never Smoker   . Smokeless tobacco: None  . Alcohol Use: No   OB History    No data available     Review of Systems  Gastrointestinal: Positive for abdominal pain.  All other systems reviewed and are negative.     Allergies  Doxycycline  Home Medications   Prior to Admission medications   Medication Sig Start Date End Date Taking? Authorizing Provider  cephALEXin (KEFLEX) 500 MG capsule Take 1 capsule (500 mg total) by mouth 2 (two) times daily. 12/27/15   Tilden FossaElizabeth Ovie Eastep, MD  dicyclomine (BENTYL) 20 MG tablet Take 1 tablet (20 mg total) by mouth 2 (two) times daily. 12/27/15   Tilden FossaElizabeth Jasyah Theurer, MD  ibuprofen (ADVIL,MOTRIN) 200 MG tablet Take 400 mg by mouth every 6 (six) hours as needed for moderate pain.    Historical Provider, MD  IRON PO Take 1 tablet by mouth daily.    Historical Provider, MD  metroNIDAZOLE (FLAGYL) 500 MG tablet Take 1 tablet (500 mg total)  by mouth 2 (two) times daily. 09/07/15   Tilden FossaElizabeth Aleysia Oltmann, MD  promethazine (PHENERGAN) 25 MG tablet Take 1 tablet (25 mg total) by mouth every 6 (six) hours as needed for nausea or vomiting. 12/27/15   Tilden FossaElizabeth Cobin Cadavid, MD  ranitidine (ZANTAC) 150 MG tablet Take 1 tablet (150 mg total) by mouth 2 (two) times daily. 12/27/15   Tilden FossaElizabeth Avila Albritton, MD   BP 119/56 mmHg  Pulse 75  Temp(Src) 98.4 F (36.9 C) (Oral)  Resp 18  SpO2 99%  LMP 12/12/2015 Physical Exam  Constitutional: She is oriented to person, place, and time. She appears well-developed and well-nourished.  HENT:  Head: Normocephalic and atraumatic.  Cardiovascular: Regular rhythm.   No murmur heard. Tachycardiac  Pulmonary/Chest: Effort normal and breath sounds normal. No respiratory distress.  Abdominal: Soft. There is no rebound and no guarding.  Mild mid right abdominal tenderness without guarding or rebound  Musculoskeletal: She exhibits no edema or tenderness.  Neurological: She is alert and oriented to person, place, and time.  Skin: Skin is warm and dry.  Psychiatric: She has a normal mood and affect. Her behavior is normal.  Nursing note and vitals reviewed.   ED Course  Procedures (including critical care time) Labs Review Labs Reviewed  COMPREHENSIVE METABOLIC PANEL - Abnormal; Notable for the following:    Glucose, Bld 110 (*)    Total Protein 8.6 (*)    All other components within  normal limits  CBC - Abnormal; Notable for the following:    Hemoglobin 11.1 (*)    MCV 71.2 (*)    MCH 21.8 (*)    RDW 16.7 (*)    All other components within normal limits  URINALYSIS, ROUTINE W REFLEX MICROSCOPIC (NOT [facility]) - Abnormal; Notable for the following:    Color, Urine AMBER (*)    APPearance CLOUDY (*)    Leukocytes, UA LARGE (*)    All other components within normal limits  URINE MICROSCOPIC-ADD ON - Abnormal; Notable for the following:    Squamous Epithelial / LPF 0-5 (*)    Bacteria, UA MANY (*)    All other  components within normal limits  LIPASE, BLOOD  I-STAT BETA HCG BLOOD, ED ([facility], [facility], [facility] ONLY)    Imaging Review [place]as Abdomen Limited Ruq  12/27/2015  CLINICAL DATA:  Abdominal pain for 2 weeks EXAM: US ABDOMEN LIMITED - RIGHT UPPER QUADRANT COMPARISON:  None. FINDINGS: Gallbladder: Small layering gallstones within the gallbladder, the largest 9 mm. No wall thickening. Negative sonographic Murphy's Common bile duct: Diameter: Normal caliber, 5 mm Liver: No focal lesion identified. Within normal limits in parenchymal echogenicity. IMPRESSION: Cholelithiasis.  No sonographic evidence of acute cholecystitis. Electronically Signed   By: Charlett NoseKevin  Dover M.D.   On: 12/27/2015 08:59   I have personally reviewed and evaluated these images and lab results as part of my medical decision-making.   EKG Interpretation None      MDM   Final diagnoses:  Abdominal pain  Biliary colic  Acute UTI    Pt here for evaluation of mid abdominal pain, nausea, dyspepsia, bloating and reflux sxs. Sxs are worse with meals.  Pt is nontoxic on exam with no peritoneal findings. Presentation is not c/w acute appendicitis.  US with cholelithiasis, no evidence of cholecystitis.  UA concerning for UTI - will treat with keflex. D/w pt home care for biliary colic, uti, reflux.  Discussed close outpatient follow up, home care, return precautions.     Tilden FossaElizabeth Ziyan Hillmer, MD 12/27/15 1556

## 2015-12-27 NOTE — ED Notes (Signed)
US at bedside

## 2015-12-27 NOTE — ED Notes (Signed)
Ultrasound at bedside

## 2015-12-27 NOTE — ED Notes (Addendum)
Pt c/o emesis, constipation, LLQ abdominal pain radiating into right flank. Pt reports reflux symptoms, "sour" sensation in throat, no history of reflux. No CVAT. Urinary frequency without dysuria.

## 2016-11-28 ENCOUNTER — Inpatient Hospital Stay (HOSPITAL_COMMUNITY)
Admission: AD | Admit: 2016-11-28 | Discharge: 2016-11-28 | Disposition: A | Discharge status: Home / Self Care | Payer: Self-pay | Source: Ambulatory Visit | Attending: Family Medicine | Admitting: Family Medicine

## 2016-11-28 ENCOUNTER — Encounter (HOSPITAL_COMMUNITY): Payer: Self-pay | Admitting: *Deleted

## 2016-11-28 ENCOUNTER — Inpatient Hospital Stay (HOSPITAL_COMMUNITY): Payer: Self-pay

## 2016-11-28 DIAGNOSIS — O3680X0 Pregnancy with inconclusive fetal viability, not applicable or unspecified: Secondary | ICD-10-CM

## 2016-11-28 DIAGNOSIS — Z6740 Type O blood, Rh positive: Secondary | ICD-10-CM | POA: Insufficient documentation

## 2016-11-28 DIAGNOSIS — O4690 Antepartum hemorrhage, unspecified, unspecified trimester: Secondary | ICD-10-CM

## 2016-11-28 DIAGNOSIS — Z3A01 Less than 8 weeks gestation of pregnancy: Secondary | ICD-10-CM | POA: Insufficient documentation

## 2016-11-28 DIAGNOSIS — Z6790 Unspecified blood type, Rh positive: Secondary | ICD-10-CM

## 2016-11-28 DIAGNOSIS — Z881 Allergy status to other antibiotic agents status: Secondary | ICD-10-CM | POA: Insufficient documentation

## 2016-11-28 DIAGNOSIS — O209 Hemorrhage in early pregnancy, unspecified: Secondary | ICD-10-CM | POA: Insufficient documentation

## 2016-11-28 LAB — WET PREP, GENITAL
Sperm: NONE SEEN
Trich, Wet Prep: NONE SEEN
Yeast Wet Prep HPF POC: NONE SEEN

## 2016-11-28 LAB — URINALYSIS, ROUTINE W REFLEX MICROSCOPIC
BILIRUBIN URINE: NEGATIVE
GLUCOSE, UA: NEGATIVE mg/dL
Ketones, ur: NEGATIVE mg/dL
NITRITE: NEGATIVE
Protein, ur: NEGATIVE mg/dL
SPECIFIC GRAVITY, URINE: 1.012 (ref 1.005–1.030)
pH: 5 (ref 5.0–8.0)

## 2016-11-28 LAB — HCG, QUANTITATIVE, PREGNANCY: HCG, BETA CHAIN, QUANT, S: 656 m[IU]/mL — AB (ref <5)

## 2016-11-28 LAB — CBC
HCT: 33.2 % — ABNORMAL LOW (ref 36.0–46.0)
Hemoglobin: 9.9 g/dL — ABNORMAL LOW (ref 12.0–15.0)
MCH: 21 pg — ABNORMAL LOW (ref 26.0–34.0)
MCHC: 29.8 g/dL — AB (ref 30.0–36.0)
MCV: 70.5 fL — ABNORMAL LOW (ref 78.0–100.0)
Platelets: 316 10*3/uL (ref 150–400)
RBC: 4.71 MIL/uL (ref 3.87–5.11)
RDW: 18.7 % — AB (ref 11.5–15.5)
WBC: 7.2 10*3/uL (ref 4.0–10.5)

## 2016-11-28 LAB — ABO/RH: ABO/RH(D): O POS

## 2016-11-28 LAB — POCT PREGNANCY, URINE: PREG TEST UR: NEGATIVE

## 2016-11-28 NOTE — Discharge Instructions (Signed)
Vaginal Bleeding During Pregnancy, First Trimester °A small amount of bleeding (spotting) from the vagina is relatively common in early pregnancy. It usually stops on its own. Various things may cause bleeding or spotting in early pregnancy. Some bleeding may be related to the pregnancy, and some may not. In most cases, the bleeding is normal and is not a problem. However, bleeding can also be a sign of something serious. Be sure to tell your health care provider about any vaginal bleeding right away. °Some possible causes of vaginal bleeding during the first trimester include: °· Infection or inflammation of the cervix. °· Growths (polyps) on the cervix. °· Miscarriage or threatened miscarriage. °· Pregnancy tissue has developed outside of the uterus and in a fallopian tube (tubal pregnancy). °· Tiny cysts have developed in the uterus instead of pregnancy tissue (molar pregnancy). °Follow these instructions at home: °Watch your condition for any changes. The following actions may help to lessen any discomfort you are feeling: °· Follow your health care provider's instructions for limiting your activity. If your health care provider orders bed rest, you may need to stay in bed and only get up to use the bathroom. However, your health care provider may allow you to continue light activity. °· If needed, make plans for someone to help with your regular activities and responsibilities while you are on bed rest. °· Keep track of the number of pads you use each day, how often you change pads, and how soaked (saturated) they are. Write this down. °· Do not use tampons. Do not douche. °· Do not have sexual intercourse or orgasms until approved by your health care provider. °· If you pass any tissue from your vagina, save the tissue so you can show it to your health care provider. °· Only take over-the-counter or prescription medicines as directed by your health care provider. °· Do not take aspirin because it can make you  bleed. °· Keep all follow-up appointments as directed by your health care provider. °Contact a health care provider if: °· You have any vaginal bleeding during any part of your pregnancy. °· You have cramps or labor pains. °· You have a fever, not controlled by medicine. °Get help right away if: °· You have severe cramps in your back or belly (abdomen). °· You pass large clots or tissue from your vagina. °· Your bleeding increases. °· You feel light-headed or weak, or you have fainting episodes. °· You have chills. °· You are leaking fluid or have a gush of fluid from your vagina. °· You pass out while having a bowel movement. °This information is not intended to replace advice given to you by your health care provider. Make sure you discuss any questions you have with your health care provider. °Document Released: 03/29/2005 Document Revised: 11/25/2015 Document Reviewed: 02/24/2013 °Elsevier Interactive Patient Education © 2017 [employer] ° °

## 2016-11-28 NOTE — MAU Provider Note (Signed)
History     CSN: [id number]  Arrival date and time: 11/28/16 0846   First Provider Initiated Contact with Patient 11/28/16 0919      No chief complaint on file.  G2P1001 @ [redacted]w[redacted]d by LMP here with VB and and pain. Bleeding started 3 days ago. Using 3-4 pads per day. LAP started around same time. Describes as constant, rates 3-4/10. She didn't take anything for it. Seen at [facility] 4 days ago for AEX and had +UPT.    Past Medical History:  Diagnosis Date  . Abscess   . Anemia     Past Surgical History:  Procedure Laterality Date  . CHOLECYSTECTOMY      History reviewed. No pertinent family history.  Social History  Substance Use Topics  . Smoking status: Never Smoker  . Smokeless tobacco: Never Used  . Alcohol use No    Allergies:  Allergies  Allergen Reactions  . Doxycycline Nausea And Vomiting    Prescriptions Prior to Admission  Medication Sig Dispense Refill Last Dose  . cephALEXin (KEFLEX) 500 MG capsule Take 1 capsule (500 mg total) by mouth 2 (two) times daily. 14 capsule 0   . dicyclomine (BENTYL) 20 MG tablet Take 1 tablet (20 mg total) by mouth 2 (two) times daily. 20 tablet 0   . ibuprofen (ADVIL,MOTRIN) 200 MG tablet Take 400 mg by mouth every 6 (six) hours as needed for moderate pain.   Past Week at Unknown time  . IRON PO Take 1 tablet by mouth daily.   09/06/2015 at Unknown time  . metroNIDAZOLE (FLAGYL) 500 MG tablet Take 1 tablet (500 mg total) by mouth 2 (two) times daily. 14 tablet 0   . promethazine (PHENERGAN) 25 MG tablet Take 1 tablet (25 mg total) by mouth every 6 (six) hours as needed for nausea or vomiting. 8 tablet 0   . ranitidine (ZANTAC) 150 MG tablet Take 1 tablet (150 mg total) by mouth 2 (two) times daily. 30 tablet 0     Review of Systems  Constitutional: Negative for fever.  Gastrointestinal: Positive for abdominal pain.  Genitourinary: Positive for frequency and vaginal bleeding. Negative for dysuria, urgency and vaginal discharge.   Musculoskeletal: Positive for back pain.   Physical Exam   Blood pressure (!) 153/76, pulse 84, temperature 98.1 F (36.7 C), temperature source Oral, resp. rate 18, height 5\' 9"  (1.753 m), weight 132 kg (291 lb), last menstrual period 10/02/2016, SpO2 100 %.  Physical Exam  Constitutional: She is oriented to person, place, and time. She appears well-developed and well-nourished. No distress (appears comfortable).  HENT:  Head: Normocephalic and atraumatic.  Neck: Normal range of motion.  Cardiovascular: Normal rate.   Respiratory: Effort normal.  GI: Soft. She exhibits no distension and no mass. There is no tenderness. There is no rebound and no guarding.  Genitourinary:  Genitourinary Comments: External: no lesions or erythema Vagina: rugated, parous/ nulli, moderate drk bloody discharge Uterus: non enlarged, anteverted, + tender, no CMT Adnexae: no masses, no tenderness left, no tenderness right   Musculoskeletal: Normal range of motion.  Neurological: She is alert and oriented to person, place, and time.  Skin: Skin is warm and dry.  Psychiatric: She has a normal mood and affect.   Results for orders placed or performed during the hospital encounter of 11/28/16 (from the past 24 hour(s))  Urinalysis, Routine w reflex microscopic     Status: Abnormal   Collection Time: 11/28/16  9:15 AM  Result Value Ref Range  Color, Urine YELLOW YELLOW   APPearance HAZY (A) CLEAR   Specific Gravity, Urine 1.012 1.005 - 1.030   pH 5.0 5.0 - 8.0   Glucose, UA NEGATIVE NEGATIVE mg/dL   Hgb urine dipstick LARGE (A) NEGATIVE   Bilirubin Urine NEGATIVE NEGATIVE   Ketones, ur NEGATIVE NEGATIVE mg/dL   Protein, ur NEGATIVE NEGATIVE mg/dL   Nitrite NEGATIVE NEGATIVE   Leukocytes, UA SMALL (A) NEGATIVE   RBC / HPF 0-5 0 - 5 RBC/hpf   WBC, UA 0-5 0 - 5 WBC/hpf   Bacteria, UA RARE (A) NONE SEEN   Squamous Epithelial / LPF 0-5 (A) NONE SEEN   Mucous PRESENT   CBC     Status: Abnormal    Collection Time: 11/28/16  9:22 AM  Result Value Ref Range   WBC 7.2 4.0 - 10.5 K/uL   RBC 4.71 3.87 - 5.11 MIL/uL   Hemoglobin 9.9 (L) 12.0 - 15.0 g/dL   HCT 59.5 (L) 63.8 - 75.6 %   MCV 70.5 (L) 78.0 - 100.0 fL   MCH 21.0 (L) 26.0 - 34.0 pg   MCHC 29.8 (L) 30.0 - 36.0 g/dL   RDW 43.3 (H) 29.5 - 18.8 %   Platelets 316 150 - 400 K/uL  ABO/Rh     Status: None   Collection Time: 11/28/16  9:22 AM  Result Value Ref Range   ABO/RH(D) O POS   hCG, quantitative, pregnancy     Status: Abnormal   Collection Time: 11/28/16  9:22 AM  Result Value Ref Range   hCG, Beta Chain, Quant, S 656 (H) <5 mIU/mL  Pregnancy, urine POC     Status: None   Collection Time: 11/28/16  9:31 AM  Result Value Ref Range   Preg Test, Ur NEGATIVE NEGATIVE  Wet prep, genital     Status: Abnormal   Collection Time: 11/28/16  9:40 AM  Result Value Ref Range   Yeast Wet Prep HPF POC NONE SEEN NONE SEEN   Trich, Wet Prep NONE SEEN NONE SEEN   Clue Cells Wet Prep HPF POC PRESENT (A) NONE SEEN   WBC, Wet Prep HPF POC FEW (A) NONE SEEN   Sperm NONE SEEN    US Ob Comp Less 14 Wks  Result Date: 11/28/2016 CLINICAL DATA:  31 year old pregnant female presents with vaginal bleeding. Quantitative beta HCG 656. EDC by LMP: 07/26/2017, projecting to an expected gestational age of [redacted] weeks 5 days. EXAM: OBSTETRIC <14 WK [place] AND TRANSVAGINAL OB US TECHNIQUE: Both transabdominal and transvaginal ultrasound examinations were performed for complete evaluation of the gestation as well as the maternal uterus, adnexal regions, and pelvic cul-de-sac. Transvaginal technique was performed to assess early pregnancy. COMPARISON:  No prior scans from this gestation. FINDINGS: There is a single intrauterine sac-like structure with apparent double decidual sac sign, mean sac diameter of 5.6 mm, which would project to a gestational age of [redacted] weeks 2 days. No yolk sac or embryo are seen within the intrauterine sac-like structure at this time . Mildly  heterogeneous endometrium with no additional focal endometrial finding. No uterine fibroids. Right ovary measures 4.1 x 2.2 x 2.7 cm and probably contains a corpus luteum as seen on the limited transvaginal images. Left ovary is not visualized on the transabdominal or transvaginal portions of the scan. No adnexal masses are demonstrated. No free fluid in the pelvis. IMPRESSION: Pregnancy is not definitively localized on this scan. Intrauterine 5.6 mm sac-like structure with apparent double decidual sac sign, which may  represent an early intrauterine gestational sac, although no yolk sac or embryo are seen at this time for definitive diagnosis. No abnormal adnexal masses. No free fluid in the pelvis. Sonographic differential diagnosis includes early intrauterine gestation, spontaneous abortion or occult ectopic gestation. Close clinical follow-up with serial serum beta HCG monitoring recommended, with follow-up obstetric scan in 2 weeks or earlier as clinically warranted. Electronically Signed   By: Delbert PhenixJason A Poff M.D.   On: 11/28/2016 12:48   [place] Ob Transvaginal  Result Date: 11/28/2016 CLINICAL DATA:  31 year old pregnant female presents with vaginal bleeding. Quantitative beta HCG 656. EDC by LMP: 07/26/2017, projecting to an expected gestational age of [redacted] weeks 5 days. EXAM: OBSTETRIC <14 WK US AND TRANSVAGINAL OB US TECHNIQUE: Both transabdominal and transvaginal ultrasound examinations were performed for complete evaluation of the gestation as well as the maternal uterus, adnexal regions, and pelvic cul-de-sac. Transvaginal technique was performed to assess early pregnancy. COMPARISON:  No prior scans from this gestation. FINDINGS: There is a single intrauterine sac-like structure with apparent double decidual sac sign, mean sac diameter of 5.6 mm, which would project to a gestational age of [redacted] weeks 2 days. No yolk sac or embryo are seen within the intrauterine sac-like structure at this time . Mildly  heterogeneous endometrium with no additional focal endometrial finding. No uterine fibroids. Right ovary measures 4.1 x 2.2 x 2.7 cm and probably contains a corpus luteum as seen on the limited transvaginal images. Left ovary is not visualized on the transabdominal or transvaginal portions of the scan. No adnexal masses are demonstrated. No free fluid in the pelvis. IMPRESSION: Pregnancy is not definitively localized on this scan. Intrauterine 5.6 mm sac-like structure with apparent double decidual sac sign, which may represent an early intrauterine gestational sac, although no yolk sac or embryo are seen at this time for definitive diagnosis. No abnormal adnexal masses. No free fluid in the pelvis. Sonographic differential diagnosis includes early intrauterine gestation, spontaneous abortion or occult ectopic gestation. Close clinical follow-up with serial serum beta HCG monitoring recommended, with follow-up obstetric scan in 2 weeks or earlier as clinically warranted. Electronically Signed   By: Delbert PhenixJason A Poff M.D.   On: 11/28/2016 12:48   MAU Course  Procedures  MDM Labs and US ordered and reviewed. Likely failed pregnancy but cannot r/o early pregnancy or ectopic pregnancy. Will obtain f/u quant HCG in 2 days. Stable for discharge home.  Assessment and Plan   1. Pregnancy, location unknown   2. Vaginal bleeding in pregnancy   3. Blood type, Rh positive    Discharge home Follow up in [facility] in 2 days for HCG Bleeding/ectopic precautions Tylenol prn pain  Allergies as of 11/28/2016      Reactions   Doxycycline Nausea And Vomiting      Medication List    STOP taking these medications   cephALEXin 500 MG capsule Commonly known as:  KEFLEX   dicyclomine 20 MG tablet Commonly known as:  BENTYL   ibuprofen 200 MG tablet Commonly known as:  ADVIL,MOTRIN   IRON PO   metroNIDAZOLE 500 MG tablet Commonly known as:  FLAGYL   promethazine 25 MG tablet Commonly known as:  PHENERGAN    ranitidine 150 MG tablet Commonly known as:  [employer]      Lauralyn Shadowens, CNM 11/28/2016, 9:39 AM

## 2016-11-28 NOTE — MAU Note (Signed)
Pt reports she had a positive preg test at home on Friday and on Saturday she started having bright red bleeding , back pain, and abd cramping. [facility] and called them this am about the bleeding and they told her to come here.

## 2016-11-29 LAB — GC/CHLAMYDIA PROBE AMP (~~LOC~~) NOT AT ARMC
CHLAMYDIA, DNA PROBE: NEGATIVE
Neisseria Gonorrhea: NEGATIVE

## 2016-11-30 ENCOUNTER — Telehealth: Payer: Self-pay | Admitting: General Practice

## 2016-11-30 ENCOUNTER — Ambulatory Visit: Payer: Self-pay | Admitting: General Practice

## 2016-11-30 DIAGNOSIS — O3680X0 Pregnancy with inconclusive fetal viability, not applicable or unspecified: Secondary | ICD-10-CM

## 2016-11-30 LAB — HCG, QUANTITATIVE, PREGNANCY: HCG, BETA CHAIN, QUANT, S: 595 m[IU]/mL — AB (ref <5)

## 2016-11-30 NOTE — Telephone Encounter (Signed)
Patient called back into front office voicing concern over amount of bleeding she was having. Patient states she has bled through her clothes and two pads in less than an hour. Patient states this is more bleeding than yesterday. Told patient to keep an eye on the bleeding and if continues like that over the next few hours, to go to MAU for further evaluation. Otherwise she may follow up on Saturday. Patient verbalized understanding & had no other questions

## 2016-11-30 NOTE — Progress Notes (Signed)
Patient here for stat bhcg today. Patient reports light bleeding/spotting & cramping. Patient will wait in lobby for results. Spoke with Dr Shawnie PonsPratt who states bhcg are not rising appropriately- if desired pregnancy, patient can follow up in MAU on Saturday for repeat bhcg. If not, patient can go ahead and receive MTX. Informed patient of results & options. Patient elects to wait until Saturday for repeat. Ectopic precautions given. Patient verbalized understanding and had no questions

## 2016-12-01 ENCOUNTER — Encounter (HOSPITAL_COMMUNITY): Payer: Self-pay | Admitting: *Deleted

## 2016-12-01 ENCOUNTER — Inpatient Hospital Stay (HOSPITAL_COMMUNITY)
Admission: AD | Admit: 2016-12-01 | Discharge: 2016-12-01 | Disposition: A | Discharge status: Home / Self Care | Payer: Self-pay | Source: Ambulatory Visit | Attending: Obstetrics and Gynecology | Admitting: Obstetrics and Gynecology

## 2016-12-01 DIAGNOSIS — O034 Incomplete spontaneous abortion without complication: Secondary | ICD-10-CM | POA: Insufficient documentation

## 2016-12-01 DIAGNOSIS — Z3A00 Weeks of gestation of pregnancy not specified: Secondary | ICD-10-CM | POA: Insufficient documentation

## 2016-12-01 DIAGNOSIS — D509 Iron deficiency anemia, unspecified: Secondary | ICD-10-CM | POA: Insufficient documentation

## 2016-12-01 DIAGNOSIS — O99012 Anemia complicating pregnancy, second trimester: Secondary | ICD-10-CM | POA: Insufficient documentation

## 2016-12-01 DIAGNOSIS — R110 Nausea: Secondary | ICD-10-CM | POA: Insufficient documentation

## 2016-12-01 LAB — CBC
HEMATOCRIT: 30.2 % — AB (ref 36.0–46.0)
Hemoglobin: 9.2 g/dL — ABNORMAL LOW (ref 12.0–15.0)
MCH: 21.2 pg — AB (ref 26.0–34.0)
MCHC: 30.5 g/dL (ref 30.0–36.0)
MCV: 69.6 fL — ABNORMAL LOW (ref 78.0–100.0)
Platelets: 226 10*3/uL (ref 150–400)
RBC: 4.34 MIL/uL (ref 3.87–5.11)
RDW: 19.7 % — AB (ref 11.5–15.5)
WBC: 7.7 10*3/uL (ref 4.0–10.5)

## 2016-12-01 LAB — HCG, QUANTITATIVE, PREGNANCY: hCG, Beta Chain, Quant, S: 366 m[IU]/mL — ABNORMAL HIGH (ref <5)

## 2016-12-01 MED ORDER — IBUPROFEN 600 MG PO TABS: 600.0000 mg | ORAL_TABLET | Freq: Four times a day (QID) | ORAL | 1 refills | Status: DC | PRN

## 2016-12-01 MED ORDER — PROMETHAZINE HCL 25 MG PO TABS: 25.0000 mg | ORAL_TABLET | Freq: Four times a day (QID) | ORAL | 2 refills | Status: DC | PRN

## 2016-12-01 NOTE — MAU Note (Signed)
Went to clinic yesterday, suspected ectopic.  Started bleeding heavily last night.  Went through an entire pack of pads, (18) was doubling them and bleeding through clothes.  Pt was to have returned tomorrow for repeat blood work.  Pt was scared, as she is already anemic, concerned with the blood loss.

## 2016-12-01 NOTE — MAU Provider Note (Signed)
Chief Complaint: Vaginal Bleeding and Abdominal Cramping   First Provider Initiated Contact with Patient 12/01/16 0919        SUBJECTIVE HPI: Gabriella [name] is a 31 y.o. G2P1 at Unknown by LMP who presents to maternity admissions reporting bleeding which became heavy last night.  HCG levels have not risen appropriately. She denies vaginal itching/burning, urinary symptoms, h/a, dizziness, n/v, or fever/chills.    Vaginal Bleeding  The patient's primary symptoms include missed menses, pelvic pain and vaginal bleeding. The patient's pertinent negatives include no genital itching, genital lesions or genital odor. This is a new problem. The current episode started yesterday. The problem occurs constantly. The problem has been unchanged. The pain is mild. The problem affects both sides. She is pregnant. Associated symptoms include abdominal pain. Pertinent negatives include no back pain, constipation, diarrhea, dysuria, fever, frequency, hematuria, nausea or vomiting. The vaginal discharge was bloody. The vaginal bleeding is heavier than menses. She has been passing clots. She has not been passing tissue. Nothing aggravates the symptoms. She has tried nothing for the symptoms.  Abdominal Cramping  This is a new problem. The current episode started yesterday. The onset quality is gradual. The problem occurs intermittently. The problem has been waxing and waning. The pain is located in the LLQ, RLQ and suprapubic region. The pain is mild. The quality of the pain is cramping. The abdominal pain does not radiate. Pertinent negatives include no constipation, diarrhea, dysuria, fever, frequency, hematuria, myalgias, nausea or vomiting. Nothing aggravates the pain. The pain is relieved by nothing. She has tried nothing for the symptoms.    RN Note: Went to clinic yesterday, suspected ectopic.  Started bleeding heavily last night.  Went through an entire pack of pads, (18) was doubling them and bleeding through  clothes.  Pt was to have returned tomorrow for repeat blood work.  Pt was scared, as she is already anemic, concerned with the blood loss.  Past Medical History:  Diagnosis Date  . Abscess   . Anemia    Past Surgical History:  Procedure Laterality Date  . CHOLECYSTECTOMY     Social History   Social History  . Marital status: Single    Spouse name: N/A  . Number of children: N/A  . Years of education: N/A   Occupational History  . Not on file.   Social History Main Topics  . Smoking status: Never Smoker  . Smokeless tobacco: Never Used  . Alcohol use No  . Drug use: No  . Sexual activity: Yes   Other Topics Concern  . Not on file   Social History Narrative  . No narrative on file   No current facility-administered medications on file prior to encounter.    No current outpatient prescriptions on file prior to encounter.   Allergies  Allergen Reactions  . Doxycycline Nausea And Vomiting    I have reviewed patient's Past Medical Hx, Surgical Hx, Family Hx, Social Hx, medications and allergies.   ROS:  Review of Systems  Constitutional: Negative for fever.  Gastrointestinal: Positive for abdominal pain. Negative for constipation, diarrhea, nausea and vomiting.  Genitourinary: Positive for missed menses, pelvic pain and vaginal bleeding. Negative for dysuria, frequency and hematuria.  Musculoskeletal: Negative for back pain and myalgias.   Review of Systems  Other systems negative   Physical Exam  Physical Exam Patient Vitals for the past 24 hrs:  BP Temp Temp src Pulse Resp SpO2 Weight  12/01/16 1114 137/80 - - 78 18 - -  12/01/16 0852 138/83 98.6 F (37 C) Oral 73 20 100 % 292 lb 8 oz (132.7 kg)   Constitutional: Well-developed, well-nourished female in no acute distress.  Cardiovascular: normal rate Respiratory: normal effort GI: Abd soft, non-tender. Pos BS x 4 MS: Extremities nontender, no edema, normal ROM Neurologic: Alert and oriented x 4.  GU:  Neg CVAT.  PELVIC EXAM: Cervix pink, visually closed, without lesion, moderately heavy bloody discharge, vaginal walls and external genitalia normal  LAB RESULTS Results for orders placed or performed during the hospital encounter of 12/01/16 (from the past 24 hour(s))  CBC     Status: Abnormal   Collection Time: 12/01/16  9:08 AM  Result Value Ref Range   WBC 7.7 4.0 - 10.5 K/uL   RBC 4.34 3.87 - 5.11 MIL/uL   Hemoglobin 9.2 (L) 12.0 - 15.0 g/dL   HCT 84.130.2 (L) 32.436.0 - 40.146.0 %   MCV 69.6 (L) 78.0 - 100.0 fL   MCH 21.2 (L) 26.0 - 34.0 pg   MCHC 30.5 30.0 - 36.0 g/dL   RDW 02.719.7 (H) 25.311.5 - 66.415.5 %   Platelets 226 150 - 400 K/uL  hCG, quantitative, pregnancy     Status: Abnormal   Collection Time: 12/01/16  9:08 AM  Result Value Ref Range   hCG, Beta Chain, Quant, S 366 (H) <5 mIU/mL   HCG from Yesterday   11/30/2016 11:10  HCG, Beta Chain, Quant, S  595 (H)   --/--/O POS (05/29 [id number])  IMAGING [place] Ob Comp Less 14 Wks  Result Date: 11/28/2016 CLINICAL DATA:  31 year old pregnant female presents with vaginal bleeding. Quantitative beta HCG 656. EDC by LMP: 07/26/2017, projecting to an expected gestational age of [redacted] weeks 5 days. EXAM: OBSTETRIC <14 WK US AND TRANSVAGINAL OB US TECHNIQUE: Both transabdominal and transvaginal ultrasound examinations were performed for complete evaluation of the gestation as well as the maternal uterus, adnexal regions, and pelvic cul-de-sac. Transvaginal technique was performed to assess early pregnancy. COMPARISON:  No prior scans from this gestation. FINDINGS: There is a single intrauterine sac-like structure with apparent double decidual sac sign, mean sac diameter of 5.6 mm, which would project to a gestational age of [redacted] weeks 2 days. No yolk sac or embryo are seen within the intrauterine sac-like structure at this time . Mildly heterogeneous endometrium with no additional focal endometrial finding. No uterine fibroids. Right ovary measures 4.1 x 2.2 x 2.7 cm  and probably contains a corpus luteum as seen on the limited transvaginal images. Left ovary is not visualized on the transabdominal or transvaginal portions of the scan. No adnexal masses are demonstrated. No free fluid in the pelvis. IMPRESSION: Pregnancy is not definitively localized on this scan. Intrauterine 5.6 mm sac-like structure with apparent double decidual sac sign, which may represent an early intrauterine gestational sac, although no yolk sac or embryo are seen at this time for definitive diagnosis. No abnormal adnexal masses. No free fluid in the pelvis. Sonographic differential diagnosis includes early intrauterine gestation, spontaneous abortion or occult ectopic gestation. Close clinical follow-up with serial serum beta HCG monitoring recommended, with follow-up obstetric scan in 2 weeks or earlier as clinically warranted. Electronically Signed   By: Delbert PhenixJason A Poff M.D.   On: 11/28/2016 12:48   [place] Ob Transvaginal  Result Date: 11/28/2016 CLINICAL DATA:  31 year old pregnant female presents with vaginal bleeding. Quantitative beta HCG 656. EDC by LMP: 07/26/2017, projecting to an expected gestational age of [redacted] weeks 5 days. EXAM: OBSTETRIC <14 WK [place]  AND TRANSVAGINAL OB US TECHNIQUE: Both transabdominal and transvaginal ultrasound examinations were performed for complete evaluation of the gestation as well as the maternal uterus, adnexal regions, and pelvic cul-de-sac. Transvaginal technique was performed to assess early pregnancy. COMPARISON:  No prior scans from this gestation. FINDINGS: There is a single intrauterine sac-like structure with apparent double decidual sac sign, mean sac diameter of 5.6 mm, which would project to a gestational age of [redacted] weeks 2 days. No yolk sac or embryo are seen within the intrauterine sac-like structure at this time . Mildly heterogeneous endometrium with no additional focal endometrial finding. No uterine fibroids. Right ovary measures 4.1 x 2.2 x 2.7 cm and  probably contains a corpus luteum as seen on the limited transvaginal images. Left ovary is not visualized on the transabdominal or transvaginal portions of the scan. No adnexal masses are demonstrated. No free fluid in the pelvis. IMPRESSION: Pregnancy is not definitively localized on this scan. Intrauterine 5.6 mm sac-like structure with apparent double decidual sac sign, which may represent an early intrauterine gestational sac, although no yolk sac or embryo are seen at this time for definitive diagnosis. No abnormal adnexal masses. No free fluid in the pelvis. Sonographic differential diagnosis includes early intrauterine gestation, spontaneous abortion or occult ectopic gestation. Close clinical follow-up with serial serum beta HCG monitoring recommended, with follow-up obstetric scan in 2 weeks or earlier as clinically warranted. Electronically Signed   By: Delbert Phenix M.D.   On: 11/28/2016 12:48    MAU Management/MDM: Ordered followup HCG which has now dropped.  Discussed option of waiting expectantly or using Cytotec to facilitate passage She elects to wait and see what happens spontaneously..  Recommend recheck HCG on Monday. Discussed Hemoglobin level is reassuring.   ASSESSMENT 1. Incomplete abortion   2. Iron deficiency anemia, unspecified iron deficiency anemia type   3.      nausea  PLAN Discharge home Plan to repeat HCG level in 3 days in clinic per 11:00 am schedule Ectopic precautions  Rx ibuprofen for cramping Rx promethazine for nausea  Follow-up Information    [facility]. Go in 3 day(s).   Contact information: [address] [place] [address] [phone]         Pt stable at time of discharge. Encouraged to return here or to other Urgent Care/ED if she develops worsening of symptoms, increase in pain, fever, or other concerning symptoms.    Wynelle Bourgeois CNM, MSN Certified Nurse-Midwife 12/01/2016  11:55 PM

## 2016-12-01 NOTE — Discharge Instructions (Signed)
Anemia, Nonspecific Anemia is a condition in which the concentration of red blood cells or hemoglobin in the blood is below normal. Hemoglobin is a substance in red blood cells that carries oxygen to the tissues of the body. Anemia results in not enough oxygen reaching these tissues. What are the causes? Common causes of anemia include:  Excessive bleeding. Bleeding may be internal or external. This includes excessive bleeding from periods (in women) or from the intestine.  Poor nutrition.  Chronic kidney, thyroid, and liver disease.  Bone marrow disorders that decrease red blood cell production.  Cancer and treatments for cancer.  HIV, AIDS, and their treatments.  Spleen problems that increase red blood cell destruction.  Blood disorders.  Excess destruction of red blood cells due to infection, medicines, and autoimmune disorders.  What are the signs or symptoms?  Minor weakness.  Dizziness.  Headache.  Palpitations.  Shortness of breath, especially with exercise.  Paleness.  Cold sensitivity.  Indigestion.  Nausea.  Difficulty sleeping.  Difficulty concentrating. Symptoms may occur suddenly or they may develop slowly. How is this diagnosed? Additional blood tests are often needed. These help your health care provider determine the best treatment. Your health care provider will check your stool for blood and look for other causes of blood loss. How is this treated? Treatment varies depending on the cause of the anemia. Treatment can include:  Supplements of iron, vitamin B12, or folic acid.  Hormone medicines.  A blood transfusion. This may be needed if blood loss is severe.  Hospitalization. This may be needed if there is significant continual blood loss.  Dietary changes.  Spleen removal.  Follow these instructions at home: Keep all follow-up appointments. It often takes many weeks to correct anemia, and having your health care provider check on  your condition and your response to treatment is very important. Get help right away if:  You develop extreme weakness, shortness of breath, or chest pain.  You become dizzy or have trouble concentrating.  You develop heavy vaginal bleeding.  You develop a rash.  You have bloody or black, tarry stools.  You faint.  You vomit up blood.  You vomit repeatedly.  You have abdominal pain.  You have a fever or persistent symptoms for more than 2-3 days.  You have a fever and your symptoms suddenly get worse.  You are dehydrated. This information is not intended to replace advice given to you by your health care provider. Make sure you discuss any questions you have with your health care provider. Document Released: 07/27/2004 Document Revised: 12/01/2015 Document Reviewed: 12/13/2012 Elsevier Interactive Patient Education  2017 [employer]. Incomplete Miscarriage A miscarriage is the sudden loss of an unborn baby (fetus) before the 20th week of pregnancy. In an incomplete miscarriage, parts of the fetus or placenta (afterbirth) remain in the body. Having a miscarriage can be an emotional experience. Talk with your health care provider about any questions you may have about miscarrying, the grieving process, and your future pregnancy plans. What are the causes?  Problems with the fetal chromosomes that make it impossible for the baby to develop normally. Problems with the baby's genes or chromosomes are most often the result of errors that occur by chance as the embryo divides and grows. The problems are not inherited from the parents.  Infection of the cervix or uterus.  Hormone problems.  Problems with the cervix, such as having an incompetent cervix. This is when the tissue in the cervix is not strong  enough to hold the pregnancy.  Problems with the uterus, such as an abnormally shaped uterus, uterine fibroids, or congenital abnormalities.  Certain medical  conditions.  Smoking, drinking alcohol, or taking illegal drugs.  Trauma. What are the signs or symptoms?  Vaginal bleeding or spotting, with or without cramps or pain.  Pain or cramping in the abdomen or lower back.  Passing fluid, tissue, or blood clots from the vagina. How is this diagnosed? Your health care provider will perform a physical exam. You may also have an ultrasound to confirm the miscarriage. Blood or urine tests may also be ordered. How is this treated?  Usually, a dilation and curettage (D&C) procedure is performed. During a D&C procedure, the cervix is widened (dilated) and any remaining fetal or placental tissue is gently removed from the uterus.  Antibiotic medicines are prescribed if there is an infection. Other medicines may be given to reduce the size of the uterus (contract) if there is a lot of bleeding.  If you have Rh negative blood and your baby was Rh positive, you will need a Rho (D) immune globulin shot. This shot will protect any future baby from having Rh blood problems in future pregnancies.  You may be confined to bed rest. This means you should stay in bed and only get up to use the bathroom. Follow these instructions at home:  Rest as directed by your health care provider.  Restrict activity as directed by your health care provider. You may be allowed to continue light activity if curettage was not done but you require further treatment.  Keep track of the number of pads you use each day. Keep track of how soaked (saturated) they are. Record this information.  Do not  use tampons.  Do not douche or have sexual intercourse until approved by your health care provider.  Keep all follow-up appointments for reevaluation and continuing management.  Only take over-the-counter or prescription medicines for pain, fever, or discomfort as directed by your health care provider.  Take antibiotic medicine as directed by your health care provider. Make  sure you finish it even if you start to feel better. Get help right away if:  You experience severe cramps in your stomach, back, or abdomen.  You have an unexplained temperature (make sure to record these temperatures).  You pass large clots or tissue (save these for your health care provider to inspect).  Your bleeding increases.  You become light-headed, weak, or have fainting episodes. This information is not intended to replace advice given to you by your health care provider. Make sure you discuss any questions you have with your health care provider. Document Released: 06/19/2005 Document Revised: 11/25/2015 Document Reviewed: 01/16/2013 Elsevier Interactive Patient Education  2017 [employer]

## 2016-12-04 ENCOUNTER — Ambulatory Visit: Payer: Self-pay | Admitting: General Practice

## 2016-12-04 ENCOUNTER — Telehealth: Payer: Self-pay | Admitting: General Practice

## 2016-12-04 DIAGNOSIS — O3680X0 Pregnancy with inconclusive fetal viability, not applicable or unspecified: Secondary | ICD-10-CM

## 2016-12-04 DIAGNOSIS — O039 Complete or unspecified spontaneous abortion without complication: Secondary | ICD-10-CM

## 2016-12-04 LAB — HCG, QUANTITATIVE, PREGNANCY: HCG, BETA CHAIN, QUANT, S: 244 m[IU]/mL — AB (ref <5)

## 2016-12-04 NOTE — Progress Notes (Signed)
Patient here for stat bhcg today. Patient reports light bleeding now and no cramping. Told patient I will call her with results in a couple hours. Patient provided number [phone]. Per Dr Shawnie PonsPratt, bhcg level has decreased appropriately. Needs follow up bhcg in 1 week and provider follow up in 2. Will call patient with results.

## 2016-12-04 NOTE — Telephone Encounter (Signed)
Called patient concerning bhcg results & recommended follow up. Patient verbalized understanding and states she can come 6/13 @ 10am. Patient will await phone call from front office. Patient had no questions

## 2016-12-13 ENCOUNTER — Other Ambulatory Visit: Payer: Self-pay

## 2016-12-26 ENCOUNTER — Encounter: Payer: Self-pay | Admitting: Obstetrics & Gynecology

## 2017-05-10 ENCOUNTER — Other Ambulatory Visit (HOSPITAL_COMMUNITY): Payer: Self-pay | Admitting: Nurse Practitioner

## 2017-05-10 DIAGNOSIS — Z369 Encounter for antenatal screening, unspecified: Secondary | ICD-10-CM

## 2017-05-10 DIAGNOSIS — Z3A13 13 weeks gestation of pregnancy: Secondary | ICD-10-CM

## 2017-05-10 LAB — OB RESULTS CONSOLE PLATELET COUNT: PLATELETS: 419

## 2017-05-10 LAB — OB RESULTS CONSOLE HIV ANTIBODY (ROUTINE TESTING): HIV: NONREACTIVE

## 2017-05-10 LAB — OB RESULTS CONSOLE RPR: RPR: NONREACTIVE

## 2017-05-10 LAB — OB RESULTS CONSOLE HGB/HCT, BLOOD
HEMATOCRIT: 33
HEMOGLOBIN: 10.1

## 2017-05-10 LAB — OB RESULTS CONSOLE GC/CHLAMYDIA
CHLAMYDIA, DNA PROBE: NEGATIVE
Gonorrhea: NEGATIVE

## 2017-05-10 LAB — OB RESULTS CONSOLE RUBELLA ANTIBODY, IGM: Rubella: IMMUNE

## 2017-05-10 LAB — OB RESULTS CONSOLE VARICELLA ZOSTER ANTIBODY, IGG: VARICELLA IGG: IMMUNE

## 2017-05-10 LAB — OB RESULTS CONSOLE HEPATITIS B SURFACE ANTIGEN: Hepatitis B Surface Ag: NEGATIVE

## 2017-05-23 ENCOUNTER — Encounter (HOSPITAL_COMMUNITY): Payer: Self-pay | Admitting: Nurse Practitioner

## 2017-05-28 ENCOUNTER — Encounter: Payer: Self-pay | Admitting: Certified Nurse Midwife

## 2017-05-28 ENCOUNTER — Ambulatory Visit (INDEPENDENT_AMBULATORY_CARE_PROVIDER_SITE_OTHER): Payer: Medicaid Other | Admitting: Certified Nurse Midwife

## 2017-05-28 VITALS — BP 145/86 | HR 88 | Ht 66.0 in | Wt 295.1 lb

## 2017-05-28 DIAGNOSIS — O0990 Supervision of high risk pregnancy, unspecified, unspecified trimester: Secondary | ICD-10-CM | POA: Insufficient documentation

## 2017-05-28 DIAGNOSIS — Z3480 Encounter for supervision of other normal pregnancy, unspecified trimester: Secondary | ICD-10-CM | POA: Diagnosis not present

## 2017-05-28 DIAGNOSIS — I10 Essential (primary) hypertension: Secondary | ICD-10-CM

## 2017-05-28 DIAGNOSIS — O10919 Unspecified pre-existing hypertension complicating pregnancy, unspecified trimester: Secondary | ICD-10-CM | POA: Insufficient documentation

## 2017-05-28 MED ORDER — ASPIRIN 81 MG PO CHEW: 81.0000 mg | CHEWABLE_TABLET | Freq: Every day | ORAL | 12 refills | Status: DC

## 2017-05-28 MED ORDER — PRENATE PIXIE 10-0.6-0.4-200 MG PO CAPS: 1.0000 | ORAL_CAPSULE | Freq: Every day | ORAL | 12 refills | Status: AC

## 2017-05-28 NOTE — Progress Notes (Signed)
Pt c/o round ligament pain

## 2017-05-28 NOTE — Progress Notes (Signed)
Subjective:   Gabriella [name] is a 31 y.o. G3P0011 at 31w3dby LMP being seen today for her first obstetrical visit.  Her obstetrical history is significant for obesity and CHTN, no hx of HTN prior to [facility]. Patient does intend to breast feed. Pregnancy history fully reviewed.  Patient reports no bleeding, no contractions, no cramping and no leaking.  HISTORY: Obstetric History   G3   P1   T0   P0   A1   L1    SAB1   TAB0   Ectopic0   Multiple0   Live Births1     # Outcome Date GA Lbr Len/2nd Weight Sex Delivery Anes PTL Lv  3 Current           2 SAB 12/01/16          1 Para 03/27/10    M Vag-Spont   LIV     Past Medical History:  Diagnosis Date  . Abscess   . Anemia   . Hypertension    Past Surgical History:  Procedure Laterality Date  . CHOLECYSTECTOMY     Family History  Problem Relation Age of Onset  . Hypertension Mother   . Asthma Mother   . Hypertension Father   . Diabetes Father   . Anemia Sister   . Heart disease Maternal Uncle   . Cancer Maternal Grandmother 32       Throat  . Diabetes Paternal Grandmother   . Kidney disease Paternal Grandmother   . Stroke Paternal Grandfather    Social History   Tobacco Use  . Smoking status: Former Smoker    Last attempt to quit: 05/28/2013    Years since quitting: 4.0  . Smokeless tobacco: Never Used  Substance Use Topics  . Alcohol use: No  . Drug use: No   Allergies  Allergen Reactions  . Doxycycline Nausea And Vomiting   Current Outpatient Medications on File Prior to Visit  Medication Sig Dispense Refill  . Prenatal Vit-Fe Fumarate-FA (MULTIVITAMIN-PRENATAL) 27-0.8 MG TABS tablet Take 1 tablet by mouth daily at 12 noon.    . promethazine (PHENERGAN) 25 MG tablet Take 1 tablet (25 mg total) by mouth every 6 (six) hours as needed for nausea or vomiting. 30 tablet 2  . ibuprofen (ADVIL,MOTRIN) 600 MG tablet Take 1  tablet (600 mg total) by mouth every 6 (six) hours as needed. 30 tablet 1   No current facility-administered medications on file prior to visit.      Exam   Vitals:   05/28/17 0833 05/28/17 1000  BP:  (!) 145/86  Pulse:  88  Weight:  295 lb 1.6 oz (133.9 kg)  Height: '5\' 6"'$  (1.676 m)       Uterus:     Pelvic Exam: Perineum: no hemorrhoids, normal perineum   Vulva: normal external genitalia, no lesions   Vagina:  normal mucosa, normal discharge   Cervix: no lesions and normal, pap smear done.    Adnexa: normal adnexa and no mass, fullness, tenderness   Bony Pelvis: average  System: General: well-developed, well-nourished female in no acute distress   Breast:  normal appearance, no masses or tenderness   Skin: normal coloration and turgor, no rashes   Neurologic: oriented, normal, negative, normal mood   Extremities: normal strength, tone, and muscle mass, ROM of all joints is normal   HEENT PERRLA, extraocular  movement intact and sclera clear, anicteric   Mouth/Teeth mucous membranes moist, pharynx normal without lesions and dental hygiene good   Neck supple and no masses   Cardiovascular: regular rate and rhythm   Respiratory:  no respiratory distress, normal breath sounds   Abdomen: soft, non-tender; bowel sounds normal; no masses,  no organomegaly     Assessment:   Pregnancy: [id number] Patient Active Problem List   Diagnosis Date Noted  . Supervision of other normal pregnancy, antepartum 05/28/2017  . Essential hypertension, malignant 05/28/2017     Plan:  1. Supervision of other normal pregnancy, antepartum       - Prenat-FeAsp-Meth-FA-DHA w/o A (PRENATE PIXIE) 10-0.6-0.4-200 MG CAPS; Take 1 tablet by mouth daily.  Dispense: 30 capsule; Refill: 12  2. Essential hypertension, malignant     - Comp Met (CMET) - Protein / creatinine ratio, urine - Hemoglobin A1c - [place] MFM OB DETAIL +14 WK; Future - aspirin 81 MG chewable tablet; Chew 1 tablet (81 mg total) by mouth  daily.  Dispense: 30 tablet; Refill: 12   Initial labs drawn. Continue prenatal vitamins. Genetic Screening discussed, declined: declined. Ultrasound discussed; fetal anatomic survey: ordered. Problem list reviewed and updated. The nature of [place] with multiple MDs and other Advanced Practice Providers was explained to patient; also emphasized that residents, students are part of our team. Routine obstetric precautions reviewed. Return in about 4 weeks (around 06/25/2017) for [place], [place].     Kandis Cocking, [place] for [employer], [place]

## 2017-05-29 ENCOUNTER — Encounter (HOSPITAL_COMMUNITY): Payer: Self-pay

## 2017-05-29 LAB — COMPREHENSIVE METABOLIC PANEL
A/G RATIO: 1.1 — AB (ref 1.2–2.2)
ALK PHOS: 119 IU/L — AB (ref 39–117)
ALT: 16 IU/L (ref 0–32)
AST: 17 IU/L (ref 0–40)
Albumin: 4.1 g/dL (ref 3.5–5.5)
BILIRUBIN TOTAL: 0.3 mg/dL (ref 0.0–1.2)
BUN/Creatinine Ratio: 8 — ABNORMAL LOW (ref 9–23)
BUN: 5 mg/dL — AB (ref 6–20)
CHLORIDE: 102 mmol/L (ref 96–106)
CO2: 22 mmol/L (ref 20–29)
Calcium: 9.8 mg/dL (ref 8.7–10.2)
Creatinine, Ser: 0.64 mg/dL (ref 0.57–1.00)
GFR calc Af Amer: 137 mL/min/{1.73_m2} (ref >59)
GFR calc non Af Amer: 118 mL/min/{1.73_m2} (ref >59)
GLUCOSE: 93 mg/dL (ref 65–99)
Globulin, Total: 3.6 g/dL (ref 1.5–4.5)
POTASSIUM: 4.5 mmol/L (ref 3.5–5.2)
Sodium: 136 mmol/L (ref 134–144)
TOTAL PROTEIN: 7.7 g/dL (ref 6.0–8.5)

## 2017-05-29 LAB — HEMOGLOBIN A1C
Est. average glucose Bld gHb Est-mCnc: 120 mg/dL
HEMOGLOBIN A1C: 5.8 % — AB (ref 4.8–5.6)

## 2017-05-29 LAB — PROTEIN / CREATININE RATIO, URINE
CREATININE, UR: 188.9 mg/dL
PROTEIN UR: 14.7 mg/dL
PROTEIN/CREAT RATIO: 78 mg/g{creat} (ref 0–200)

## 2017-05-30 ENCOUNTER — Encounter: Payer: Self-pay | Admitting: *Deleted

## 2017-05-31 ENCOUNTER — Other Ambulatory Visit: Payer: Self-pay | Admitting: Certified Nurse Midwife

## 2017-05-31 ENCOUNTER — Ambulatory Visit (HOSPITAL_COMMUNITY): Payer: Medicaid Other

## 2017-05-31 ENCOUNTER — Ambulatory Visit (HOSPITAL_COMMUNITY)
Admission: RE | Admit: 2017-05-31 | Discharge: 2017-05-31 | Disposition: A | Discharge status: Home / Self Care | Payer: Medicaid Other | Source: Ambulatory Visit | Attending: Nurse Practitioner | Admitting: Nurse Practitioner

## 2017-05-31 DIAGNOSIS — R7309 Other abnormal glucose: Secondary | ICD-10-CM | POA: Insufficient documentation

## 2017-06-01 ENCOUNTER — Telehealth: Payer: Self-pay

## 2017-06-01 ENCOUNTER — Other Ambulatory Visit: Payer: Medicaid Other

## 2017-06-01 NOTE — Telephone Encounter (Signed)
Patient notified and appt scheduled.

## 2017-06-01 NOTE — Telephone Encounter (Signed)
-----   Message from Roe Coombsachelle A Denney, CNM sent at 05/31/2017  1:53 PM EST ----- Please let her know that she needs an early 2 hour OGTT ASAP.  Thank you. R.Denney CNM

## 2017-06-03 ENCOUNTER — Encounter: Payer: Self-pay | Admitting: Obstetrics & Gynecology

## 2017-06-04 ENCOUNTER — Other Ambulatory Visit: Payer: Medicaid Other

## 2017-06-04 DIAGNOSIS — O0990 Supervision of high risk pregnancy, unspecified, unspecified trimester: Secondary | ICD-10-CM

## 2017-06-05 LAB — GLUCOSE TOLERANCE, 2 HOURS W/ 1HR
GLUCOSE, FASTING: 89 mg/dL (ref 65–91)
Glucose, 1 hour: 118 mg/dL (ref 65–179)
Glucose, 2 hour: 91 mg/dL (ref 65–152)

## 2017-06-06 ENCOUNTER — Other Ambulatory Visit: Payer: Self-pay | Admitting: Certified Nurse Midwife

## 2017-06-06 DIAGNOSIS — O0992 Supervision of high risk pregnancy, unspecified, second trimester: Secondary | ICD-10-CM

## 2017-06-27 ENCOUNTER — Encounter: Payer: Self-pay | Admitting: Certified Nurse Midwife

## 2017-06-27 ENCOUNTER — Ambulatory Visit (INDEPENDENT_AMBULATORY_CARE_PROVIDER_SITE_OTHER): Payer: Medicaid Other | Admitting: Certified Nurse Midwife

## 2017-06-27 VITALS — BP 132/87 | HR 87 | Wt 289.0 lb

## 2017-06-27 DIAGNOSIS — O10919 Unspecified pre-existing hypertension complicating pregnancy, unspecified trimester: Secondary | ICD-10-CM

## 2017-06-27 DIAGNOSIS — O10912 Unspecified pre-existing hypertension complicating pregnancy, second trimester: Secondary | ICD-10-CM

## 2017-06-27 DIAGNOSIS — O0992 Supervision of high risk pregnancy, unspecified, second trimester: Secondary | ICD-10-CM

## 2017-06-27 NOTE — Patient Instructions (Signed)
Second Trimester of Pregnancy The second trimester is from week 13 through week 28, month 4 through 6. This is often the time in pregnancy that you feel your best. Often times, morning sickness has lessened or quit. You may have more energy, and you may get hungry more often. Your unborn baby (fetus) is growing rapidly. At the end of the sixth month, he or she is about 9 inches long and weighs about 1 pounds. You will likely feel the baby move (quickening) between 18 and 20 weeks of pregnancy. Follow these instructions at home:  Avoid all smoking, herbs, and alcohol. Avoid drugs not approved by your doctor.  Do not use any tobacco products, including cigarettes, chewing tobacco, and electronic cigarettes. If you need help quitting, ask your doctor. You may get counseling or other support to help you quit.  Only take medicine as told by your doctor. Some medicines are safe and some are not during pregnancy.  Exercise only as told by your doctor. Stop exercising if you start having cramps.  Eat regular, healthy meals.  Wear a good support bra if your breasts are tender.  Do not use hot tubs, steam rooms, or saunas.  Wear your seat belt when driving.  Avoid raw meat, uncooked cheese, and liter boxes and soil used by cats.  Take your prenatal vitamins.  Take 1500-2000 milligrams of calcium daily starting at the 20th week of pregnancy until you deliver your baby.  Try taking medicine that helps you poop (stool softener) as needed, and if your doctor approves. Eat more fiber by eating fresh fruit, vegetables, and whole grains. Drink enough fluids to keep your pee (urine) clear or pale yellow.  Take warm water baths (sitz baths) to soothe pain or discomfort caused by hemorrhoids. Use hemorrhoid cream if your doctor approves.  If you have puffy, bulging veins (varicose veins), wear support hose. Raise (elevate) your feet for 15 minutes, 3-4 times a day. Limit salt in your diet.  Avoid heavy  lifting, wear low heals, and sit up straight.  Rest with your legs raised if you have leg cramps or low back pain.  Visit your dentist if you have not gone during your pregnancy. Use a soft toothbrush to brush your teeth. Be gentle when you floss.  You can have sex (intercourse) unless your doctor tells you not to.  Go to your doctor visits. Get help if:  You feel dizzy.  You have mild cramps or pressure in your lower belly (abdomen).  You have a nagging pain in your belly area.  You continue to feel sick to your stomach (nauseous), throw up (vomit), or have watery poop (diarrhea).  You have bad smelling fluid coming from your vagina.  You have pain with peeing (urination). Get help right away if:  You have a fever.  You are leaking fluid from your vagina.  You have spotting or bleeding from your vagina.  You have severe belly cramping or pain.  You lose or gain weight rapidly.  You have trouble catching your breath and have chest pain.  You notice sudden or extreme puffiness (swelling) of your face, hands, ankles, feet, or legs.  You have not felt the baby move in over an hour.  You have severe headaches that do not go away with medicine.  You have vision changes. This information is not intended to replace advice given to you by your health care provider. Make sure you discuss any questions you have with your health care   provider. Document Released: 09/13/2009 Document Revised: 11/25/2015 Document Reviewed: 08/20/2012 Elsevier Interactive Patient Education  2017 Elsevier Inc.  

## 2017-06-27 NOTE — Progress Notes (Signed)
   PRENATAL VISIT NOTE  Subjective:  Gabriella [name] is a [age] y.o. G3P1011 at 10071w5d being seen today for ongoing prenatal care.  She is currently monitored for the following issues for this high-risk pregnancy and has Supervision of high-risk pregnancy; Chronic hypertension complicating pregnancy, antepartum; and Elevated hemoglobin A1c on their problem list.  Patient reports no complaints.  Contractions: Not present. Vag. Bleeding: None.  Movement: Absent. Denies leaking of fluid.   The following portions of the patient's history were reviewed and updated as appropriate: allergies, current medications, past family history, past medical history, past social history, past surgical history and problem list. Problem list updated.  Objective:   Vitals:   06/27/17 0859  BP: 132/87  Pulse: 87  Weight: 289 lb (131.1 kg)    Fetal Status: Fetal Heart Rate (bpm): 160; doppler    Movement: Absent     General:  Alert, oriented and cooperative. Patient is in no acute distress.  Skin: Skin is warm and dry. No rash noted.   Cardiovascular: Normal heart rate noted  Respiratory: Normal respiratory effort, no problems with respiration noted  Abdomen: Soft, gravid, appropriate for gestational age.  Pain/Pressure: Present     Pelvic: Cervical exam deferred        Extremities: Normal range of motion.  Edema: None  Mental Status:  Normal mood and affect. Normal behavior. Normal judgment and thought content.   Assessment and Plan:  Pregnancy: G3P1011 at 2571w5d  1. Supervision of high risk pregnancy in second trimester -US scheduled for 07/04/17   2. Chronic hypertension complicating pregnancy, antepartum -Patient taking baby aspirin as discussed  -BP stable at this time   Preterm labor symptoms and general obstetric precautions including but not limited to vaginal bleeding, contractions, leaking of fluid and fetal movement were reviewed in detail with the patient. Please refer to After Visit Summary  for other counseling recommendations.  Return in about 4 weeks (around 07/25/2017) for ROB.   Sharyon CableVeronica C Antrice Pal, CNM

## 2017-06-27 NOTE — Progress Notes (Signed)
ROB with no concerns today.   

## 2017-07-03 NOTE — L&D Delivery Note (Signed)
Patient is 32 y.o. [id number] 1964w2d admitted for IOL for A2GDM. S/p IOL with Pitocin. AROM at 1135.  Prenatal course also complicated by cHTN.  Delivery Note At 5:01 PM a viable female was delivered via Vaginal, Spontaneous (Presentation: LOA  ).  APGAR: 9, 9; weight pending 1hr skin to skin.  Placenta status: Intact.  Cord: 3V with the following complications: None.  Cord pH: N/A  Anesthesia: Epidural  Episiotomy: None Lacerations: Periurethral Suture Repair: 3.0 monocryl Est. Blood Loss (mL): 100   Mom to postpartum.  Baby to Couplet care / Skin to Skin.  Caryl AdaJazma Joetta Delprado, DO 12/02/2017, 5:21 PM [facility]

## 2017-07-04 ENCOUNTER — Other Ambulatory Visit (HOSPITAL_COMMUNITY): Payer: Self-pay | Admitting: *Deleted

## 2017-07-04 ENCOUNTER — Ambulatory Visit (HOSPITAL_COMMUNITY)
Admission: RE | Admit: 2017-07-04 | Discharge: 2017-07-04 | Disposition: A | Discharge status: Home / Self Care | Payer: Medicaid Other | Source: Ambulatory Visit | Attending: Certified Nurse Midwife | Admitting: Certified Nurse Midwife

## 2017-07-04 ENCOUNTER — Other Ambulatory Visit: Payer: Self-pay | Admitting: Certified Nurse Midwife

## 2017-07-04 ENCOUNTER — Encounter (HOSPITAL_COMMUNITY): Payer: Self-pay

## 2017-07-04 DIAGNOSIS — Z3A17 17 weeks gestation of pregnancy: Secondary | ICD-10-CM | POA: Diagnosis not present

## 2017-07-04 DIAGNOSIS — IMO0002 Reserved for concepts with insufficient information to code with codable children: Secondary | ICD-10-CM

## 2017-07-04 DIAGNOSIS — Z3689 Encounter for other specified antenatal screening: Secondary | ICD-10-CM | POA: Insufficient documentation

## 2017-07-04 DIAGNOSIS — O10919 Unspecified pre-existing hypertension complicating pregnancy, unspecified trimester: Secondary | ICD-10-CM

## 2017-07-04 DIAGNOSIS — O99210 Obesity complicating pregnancy, unspecified trimester: Secondary | ICD-10-CM

## 2017-07-04 DIAGNOSIS — O99212 Obesity complicating pregnancy, second trimester: Secondary | ICD-10-CM | POA: Diagnosis not present

## 2017-07-04 DIAGNOSIS — O10912 Unspecified pre-existing hypertension complicating pregnancy, second trimester: Secondary | ICD-10-CM | POA: Diagnosis not present

## 2017-07-04 DIAGNOSIS — I10 Essential (primary) hypertension: Secondary | ICD-10-CM

## 2017-07-04 DIAGNOSIS — Z0489 Encounter for examination and observation for other specified reasons: Secondary | ICD-10-CM

## 2017-07-09 ENCOUNTER — Other Ambulatory Visit: Payer: Self-pay | Admitting: Certified Nurse Midwife

## 2017-07-09 DIAGNOSIS — O0992 Supervision of high risk pregnancy, unspecified, second trimester: Secondary | ICD-10-CM

## 2017-07-25 ENCOUNTER — Encounter: Payer: Medicaid Other | Admitting: Certified Nurse Midwife

## 2017-07-26 ENCOUNTER — Ambulatory Visit (INDEPENDENT_AMBULATORY_CARE_PROVIDER_SITE_OTHER): Payer: Medicaid Other | Admitting: Certified Nurse Midwife

## 2017-07-26 ENCOUNTER — Encounter: Payer: Self-pay | Admitting: Certified Nurse Midwife

## 2017-07-26 VITALS — Wt 290.2 lb

## 2017-07-26 DIAGNOSIS — R7309 Other abnormal glucose: Secondary | ICD-10-CM

## 2017-07-26 DIAGNOSIS — O10919 Unspecified pre-existing hypertension complicating pregnancy, unspecified trimester: Secondary | ICD-10-CM

## 2017-07-26 DIAGNOSIS — O0990 Supervision of high risk pregnancy, unspecified, unspecified trimester: Secondary | ICD-10-CM

## 2017-07-26 NOTE — Progress Notes (Signed)
   PRENATAL VISIT NOTE  Subjective:  Gabriella [name] is a 32 y.o. G3P1011 at 5454w6d being seen today for ongoing prenatal care.  She is currently monitored for the following issues for this high-risk pregnancy and has Supervision of high-risk pregnancy; Chronic hypertension complicating pregnancy, antepartum; and Elevated hemoglobin A1c on their problem list.  Patient reports no complaints.  Contractions: Not present. Vag. Bleeding: None.  Movement: Present. Denies leaking of fluid.   The following portions of the patient's history were reviewed and updated as appropriate: allergies, current medications, past family history, past medical history, past social history, past surgical history and problem list. Problem list updated.  Objective:   Vitals:   07/26/17 0916  Weight: 290 lb 3.2 oz (131.6 kg)    Fetal Status: Fetal Heart Rate (bpm): 158; doppler Fundal Height: 24 cm Movement: Present     General:  Alert, oriented and cooperative. Patient is in no acute distress.  Skin: Skin is warm and dry. No rash noted.   Cardiovascular: Normal heart rate noted  Respiratory: Normal respiratory effort, no problems with respiration noted  Abdomen: Soft, gravid, appropriate for gestational age.  Pain/Pressure: Present     Pelvic: Cervical exam deferred        Extremities: Normal range of motion.  Edema: None  Mental Status:  Normal mood and affect. Normal behavior. Normal judgment and thought content.   Assessment and Plan:  Pregnancy: G3P1011 at 8854w6d  1. Supervision of high risk pregnancy, antepartum     Doing well.  Normotensive today, taking baby ASA.  States that she is exercising every day. Encouragement given.    2. Elevated hemoglobin A1c     Normal early 2 hour OGTT  Preterm labor symptoms and general obstetric precautions including but not limited to vaginal bleeding, contractions, leaking of fluid and fetal movement were reviewed in detail with the patient. Please refer to After  Visit Summary for other counseling recommendations.  Return in about 4 weeks (around 08/23/2017) for ROB, HOB.   Roe Coombsachelle A Denney, CNM

## 2017-07-26 NOTE — Patient Instructions (Addendum)
Second Trimester of Pregnancy The second trimester is from week 14 through week 27 (months 4 through 6). The second trimester is often a time when you feel your best. Your body has adjusted to being pregnant, and you begin to feel better physically. Usually, morning sickness has lessened or quit completely, you may have more energy, and you may have an increase in appetite. The second trimester is also a time when the fetus is growing rapidly. At the end of the sixth month, the fetus is about 9 inches long and weighs about 1 pounds. You will likely begin to feel the baby move (quickening) between 16 and 20 weeks of pregnancy. Body changes during your second trimester Your body continues to go through many changes during your second trimester. The changes vary from woman to woman.  Your weight will continue to increase. You will notice your lower abdomen bulging out.  You may begin to get stretch marks on your hips, abdomen, and breasts.  You may develop headaches that can be relieved by medicines. The medicines should be approved by your health care provider.  You may urinate more often because the fetus is pressing on your bladder.  You may develop or continue to have heartburn as a result of your pregnancy.  You may develop constipation because certain hormones are causing the muscles that push waste through your intestines to slow down.  You may develop hemorrhoids or swollen, bulging veins (varicose veins).  You may have back pain. This is caused by: ? Weight gain. ? Pregnancy hormones that are relaxing the joints in your pelvis. ? A shift in weight and the muscles that support your balance.  Your breasts will continue to grow and they will continue to become tender.  Your gums may bleed and may be sensitive to brushing and flossing.  Dark spots or blotches (chloasma, mask of pregnancy) may develop on your face. This will likely fade after the baby is born.  A dark line from your  belly button to the pubic area (linea nigra) may appear. This will likely fade after the baby is born.  You may have changes in your hair. These can include thickening of your hair, rapid growth, and changes in texture. Some women also have hair loss during or after pregnancy, or hair that feels dry or thin. Your hair will most likely return to normal after your baby is born.  What to expect at prenatal visits During a routine prenatal visit:  You will be weighed to make sure you and the fetus are growing normally.  Your blood pressure will be taken.  Your abdomen will be measured to track your baby's growth.  The fetal heartbeat will be listened to.  Any test results from the previous visit will be discussed.  Your health care provider may ask you:  How you are feeling.  If you are feeling the baby move.  If you have had any abnormal symptoms, such as leaking fluid, bleeding, severe headaches, or abdominal cramping.  If you are using any tobacco products, including cigarettes, chewing tobacco, and electronic cigarettes.  If you have any questions.  Other tests that may be performed during your second trimester include:  Blood tests that check for: ? Low iron levels (anemia). ? High blood sugar that affects pregnant women (gestational diabetes) between 24 and 28 weeks. ? Rh antibodies. This is to check for a protein on red blood cells (Rh factor).  Urine tests to check for infections, diabetes, or   protein in the urine.  An ultrasound to confirm the proper growth and development of the baby.  An amniocentesis to check for possible genetic problems.  Fetal screens for spina bifida and Down syndrome.  HIV (human immunodeficiency virus) testing. Routine prenatal testing includes screening for HIV, unless you choose not to have this test.  Follow these instructions at home: Medicines  Follow your health care provider's instructions regarding medicine use. Specific  medicines may be either safe or unsafe to take during pregnancy.  Take a prenatal vitamin that contains at least 600 micrograms (mcg) of folic acid.  If you develop constipation, try taking a stool softener if your health care provider approves. Eating and drinking  Eat a balanced diet that includes fresh fruits and vegetables, whole grains, good sources of protein such as meat, eggs, or tofu, and low-fat dairy. Your health care provider will help you determine the amount of weight gain that is right for you.  Avoid raw meat and uncooked cheese. These carry germs that can cause birth defects in the baby.  If you have low calcium intake from food, talk to your health care provider about whether you should take a daily calcium supplement.  Limit foods that are high in fat and processed sugars, such as fried and sweet foods.  To prevent constipation: ? Drink enough fluid to keep your urine clear or pale yellow. ? Eat foods that are high in fiber, such as fresh fruits and vegetables, whole grains, and beans. Activity  Exercise only as directed by your health care provider. Most women can continue their usual exercise routine during pregnancy. Try to exercise for 30 minutes at least 5 days a week. Stop exercising if you experience uterine contractions.  Avoid heavy lifting, wear low heel shoes, and practice good posture.  A sexual relationship may be continued unless your health care provider directs you otherwise. Relieving pain and discomfort  Wear a good support bra to prevent discomfort from breast tenderness.  Take warm sitz baths to soothe any pain or discomfort caused by hemorrhoids. Use hemorrhoid cream if your health care provider approves.  Rest with your legs elevated if you have leg cramps or low back pain.  If you develop varicose veins, wear support hose. Elevate your feet for 15 minutes, 3-4 times a day. Limit salt in your diet. Prenatal Care  Write down your questions.  Take them to your prenatal visits.  Keep all your prenatal visits as told by your health care provider. This is important. Safety  Wear your seat belt at all times when driving.  Make a list of emergency phone numbers, including numbers for family, friends, the hospital, and police and fire departments. General instructions  Ask your health care provider for a referral to a local prenatal education class. Begin classes no later than the beginning of month 6 of your pregnancy.  Ask for help if you have counseling or nutritional needs during pregnancy. Your health care provider can offer advice or refer you to specialists for help with various needs.  Do not use hot tubs, steam rooms, or saunas.  Do not douche or use tampons or scented sanitary pads.  Do not cross your legs for long periods of time.  Avoid cat litter boxes and soil used by cats. These carry germs that can cause birth defects in the baby and possibly loss of the fetus by miscarriage or stillbirth.  Avoid all smoking, herbs, alcohol, and unprescribed drugs. Chemicals in these products can   affect the formation and growth of the baby.  Do not use any products that contain nicotine or tobacco, such as cigarettes and e-cigarettes. If you need help quitting, ask your health care provider.  Visit your dentist if you have not gone yet during your pregnancy. Use a soft toothbrush to brush your teeth and be gentle when you floss. Contact a health care provider if:  You have dizziness.  You have mild pelvic cramps, pelvic pressure, or nagging pain in the abdominal area.  You have persistent nausea, vomiting, or diarrhea.  You have a bad smelling vaginal discharge.  You have pain when you urinate. Get help right away if:  You have a fever.  You are leaking fluid from your vagina.  You have spotting or bleeding from your vagina.  You have severe abdominal cramping or pain.  You have rapid weight gain or weight  loss.  You have shortness of breath with chest pain.  You notice sudden or extreme swelling of your face, hands, ankles, feet, or legs.  You have not felt your baby move in over an hour.  You have severe headaches that do not go away when you take medicine.  You have vision changes. Summary  The second trimester is from week 14 through week 27 (months 4 through 6). It is also a time when the fetus is growing rapidly.  Your body goes through many changes during pregnancy. The changes vary from woman to woman.  Avoid all smoking, herbs, alcohol, and unprescribed drugs. These chemicals affect the formation and growth your baby.  Do not use any tobacco products, such as cigarettes, chewing tobacco, and e-cigarettes. If you need help quitting, ask your health care provider.  Contact your health care provider if you have any questions. Keep all prenatal visits as told by your health care provider. This is important. This information is not intended to replace advice given to you by your health care provider. Make sure you discuss any questions you have with your health care provider. Document Released: 06/13/2001 Document Revised: 07/25/2016 Document Reviewed: 07/25/2016 Elsevier Interactive Patient Education  2018 [employer].  Preterm Labor and Birth Information The normal length of a pregnancy is 39-41 weeks. Preterm labor is when labor starts before 37 completed weeks of pregnancy. What are the risk factors for preterm labor? Preterm labor is more likely to occur in women who:  Have certain infections during pregnancy such as a bladder infection, sexually transmitted infection, or infection inside the uterus (chorioamnionitis).  Have a shorter-than-normal cervix.  Have gone into preterm labor before.  Have had surgery on their cervix.  Are younger than age 70 or older than age 73.  Are African American.  Are pregnant with twins or multiple babies (multiple  gestation).  Take street drugs or smoke while pregnant.  Do not gain enough weight while pregnant.  Became pregnant shortly after having been pregnant.  What are the symptoms of preterm labor? Symptoms of preterm labor include:  Cramps similar to those that can happen during a menstrual period. The cramps may happen with diarrhea.  Pain in the abdomen or lower back.  Regular uterine contractions that may feel like tightening of the abdomen.  A feeling of increased pressure in the pelvis.  Increased watery or bloody mucus discharge from the vagina.  Water breaking (ruptured amniotic sac).  Why is it important to recognize signs of preterm labor? It is important to recognize signs of preterm labor because babies who are born  prematurely may not be fully developed. This can put them at an increased risk for:  Long-term (chronic) heart and lung problems.  Difficulty immediately after birth with regulating body systems, including blood sugar, body temperature, heart rate, and breathing rate.  Bleeding in the brain.  Cerebral palsy.  Learning difficulties.  Death.  These risks are highest for babies who are born before 34 weeks of pregnancy. How is preterm labor treated? Treatment depends on the length of your pregnancy, your condition, and the health of your baby. It may involve:  Having a stitch (suture) placed in your cervix to prevent your cervix from opening too early (cerclage).  Taking or being given medicines, such as: ? Hormone medicines. These may be given early in pregnancy to help support the pregnancy. ? Medicine to stop contractions. ? Medicines to help mature the baby's lungs. These may be prescribed if the risk of delivery is high. ? Medicines to prevent your baby from developing cerebral palsy.  If the labor happens before 34 weeks of pregnancy, you may need to stay in the hospital. What should I do if I think I am in preterm labor? If you think that you  are going into preterm labor, call your health care provider right away. How can I prevent preterm labor in future pregnancies? To increase your chance of having a full-term pregnancy:  Do not use any tobacco products, such as cigarettes, chewing tobacco, and e-cigarettes. If you need help quitting, ask your health care provider.  Do not use street drugs or medicines that have not been prescribed to you during your pregnancy.  Talk with your health care provider before taking any herbal supplements, even if you have been taking them regularly.  Make sure you gain a healthy amount of weight during your pregnancy.  Watch for infection. If you think that you might have an infection, get it checked right away.  Make sure to tell your health care provider if you have gone into preterm labor before.  This information is not intended to replace advice given to you by your health care provider. Make sure you discuss any questions you have with your health care provider. Document Released: 09/09/2003 Document Revised: 11/30/2015 Document Reviewed: 11/10/2015 Elsevier Interactive Patient Education  2018 [employer]

## 2017-08-15 ENCOUNTER — Other Ambulatory Visit (HOSPITAL_COMMUNITY): Payer: Self-pay | Admitting: *Deleted

## 2017-08-15 ENCOUNTER — Encounter (HOSPITAL_COMMUNITY): Payer: Self-pay

## 2017-08-15 ENCOUNTER — Other Ambulatory Visit (HOSPITAL_COMMUNITY): Payer: Self-pay | Admitting: Maternal and Fetal Medicine

## 2017-08-15 ENCOUNTER — Ambulatory Visit (HOSPITAL_COMMUNITY)
Admission: RE | Admit: 2017-08-15 | Discharge: 2017-08-15 | Disposition: A | Discharge status: Home / Self Care | Payer: Medicaid Other | Source: Ambulatory Visit | Attending: Certified Nurse Midwife | Admitting: Certified Nurse Midwife

## 2017-08-15 DIAGNOSIS — Z3A23 23 weeks gestation of pregnancy: Secondary | ICD-10-CM | POA: Insufficient documentation

## 2017-08-15 DIAGNOSIS — Z362 Encounter for other antenatal screening follow-up: Secondary | ICD-10-CM | POA: Diagnosis not present

## 2017-08-15 DIAGNOSIS — O10019 Pre-existing essential hypertension complicating pregnancy, unspecified trimester: Secondary | ICD-10-CM

## 2017-08-15 DIAGNOSIS — O10919 Unspecified pre-existing hypertension complicating pregnancy, unspecified trimester: Secondary | ICD-10-CM

## 2017-08-15 DIAGNOSIS — O09292 Supervision of pregnancy with other poor reproductive or obstetric history, second trimester: Secondary | ICD-10-CM | POA: Diagnosis not present

## 2017-08-15 DIAGNOSIS — IMO0002 Reserved for concepts with insufficient information to code with codable children: Secondary | ICD-10-CM

## 2017-08-15 DIAGNOSIS — O99212 Obesity complicating pregnancy, second trimester: Secondary | ICD-10-CM | POA: Diagnosis not present

## 2017-08-15 DIAGNOSIS — Z0489 Encounter for examination and observation for other specified reasons: Secondary | ICD-10-CM

## 2017-08-15 NOTE — Addendum Note (Signed)
Encounter addended by: Lenise ArenaBazemore, Nimco Bivens, RDMS on: 08/15/2017 9:41 AM  Actions taken: Imaging Exam ended

## 2017-08-23 ENCOUNTER — Ambulatory Visit (INDEPENDENT_AMBULATORY_CARE_PROVIDER_SITE_OTHER): Payer: Medicaid Other | Admitting: Certified Nurse Midwife

## 2017-08-23 ENCOUNTER — Encounter: Payer: Self-pay | Admitting: Certified Nurse Midwife

## 2017-08-23 VITALS — BP 140/85 | HR 98 | Wt 295.1 lb

## 2017-08-23 DIAGNOSIS — O0990 Supervision of high risk pregnancy, unspecified, unspecified trimester: Secondary | ICD-10-CM

## 2017-08-23 DIAGNOSIS — O10919 Unspecified pre-existing hypertension complicating pregnancy, unspecified trimester: Secondary | ICD-10-CM

## 2017-08-23 NOTE — Progress Notes (Signed)
Patient reports good fetal movement, denies pain, and blurred vision.

## 2017-08-23 NOTE — Progress Notes (Signed)
   PRENATAL VISIT NOTE  Subjective:  Gabriella [name] is a 32 y.o. G3P1011 at 964w6d being seen today for ongoing prenatal care.  She is currently monitored for the following issues for this high-risk pregnancy and has Supervision of high-risk pregnancy; Chronic hypertension complicating pregnancy, antepartum; and Elevated hemoglobin A1c on their problem list.  Patient reports no bleeding, no contractions, no cramping, no leaking and uri symptoms X 1week; denies fever.  Contractions: Not present. Vag. Bleeding: None.  Movement: Present. Denies leaking of fluid.   The following portions of the patient's history were reviewed and updated as appropriate: allergies, current medications, past family history, past medical history, past social history, past surgical history and problem list. Problem list updated.  Objective:   Vitals:   08/23/17 0849 08/23/17 0851  BP: (!) 141/95 140/85  Pulse: (!) 104 98  Weight: 295 lb 1.6 oz (133.9 kg)     Fetal Status: Fetal Heart Rate (bpm): 152; doppler Fundal Height: 32 cm Movement: Present     General:  Alert, oriented and cooperative. Patient is in no acute distress.  Skin: Skin is warm and dry. No rash noted.   Cardiovascular: Normal heart rate noted  Respiratory: Normal respiratory effort, no problems with respiration noted  Abdomen: Soft, gravid, appropriate for gestational age.  Pain/Pressure: Absent     Pelvic: Cervical exam deferred        Extremities: Normal range of motion.  Edema: None  Mental Status:  Normal mood and affect. Normal behavior. Normal judgment and thought content.   Assessment and Plan:  Pregnancy: G3P1011 at 1064w6d  1. Supervision of high risk pregnancy, antepartum     Elevated blood pressure today, has CHTN no severe ranges.  Has been ill and eating lots of salty foods.  Discussed OTC cold medications.  US 08/15/17: normal.  2. Chronic hypertension complicating pregnancy, antepartum     Taking Baby ASA.   Preterm labor  symptoms and general obstetric precautions including but not limited to vaginal bleeding, contractions, leaking of fluid and fetal movement were reviewed in detail with the patient. Please refer to After Visit Summary for other counseling recommendations.  [facility] in about 3 weeks (around 09/13/2017) [facility], Needs to see FP MD here.   Roe Coombsachelle A Denney, CNM

## 2017-09-12 ENCOUNTER — Ambulatory Visit (HOSPITAL_COMMUNITY)
Admission: RE | Admit: 2017-09-12 | Discharge: 2017-09-12 | Disposition: A | Discharge status: Home / Self Care | Payer: Medicaid Other | Source: Ambulatory Visit | Attending: Certified Nurse Midwife | Admitting: Certified Nurse Midwife

## 2017-09-12 ENCOUNTER — Other Ambulatory Visit (HOSPITAL_COMMUNITY): Payer: Self-pay | Admitting: Obstetrics and Gynecology

## 2017-09-12 ENCOUNTER — Encounter (HOSPITAL_COMMUNITY): Payer: Self-pay

## 2017-09-12 DIAGNOSIS — O09292 Supervision of pregnancy with other poor reproductive or obstetric history, second trimester: Secondary | ICD-10-CM | POA: Diagnosis not present

## 2017-09-12 DIAGNOSIS — O10012 Pre-existing essential hypertension complicating pregnancy, second trimester: Secondary | ICD-10-CM | POA: Insufficient documentation

## 2017-09-12 DIAGNOSIS — Z3A27 27 weeks gestation of pregnancy: Secondary | ICD-10-CM

## 2017-09-12 DIAGNOSIS — O99212 Obesity complicating pregnancy, second trimester: Secondary | ICD-10-CM | POA: Diagnosis not present

## 2017-09-12 DIAGNOSIS — O99210 Obesity complicating pregnancy, unspecified trimester: Secondary | ICD-10-CM

## 2017-09-12 DIAGNOSIS — O10919 Unspecified pre-existing hypertension complicating pregnancy, unspecified trimester: Secondary | ICD-10-CM

## 2017-09-12 DIAGNOSIS — Z362 Encounter for other antenatal screening follow-up: Secondary | ICD-10-CM

## 2017-09-13 ENCOUNTER — Other Ambulatory Visit: Payer: Medicaid Other

## 2017-09-13 ENCOUNTER — Encounter: Payer: Self-pay | Admitting: Obstetrics and Gynecology

## 2017-09-13 ENCOUNTER — Ambulatory Visit (INDEPENDENT_AMBULATORY_CARE_PROVIDER_SITE_OTHER): Payer: Medicaid Other | Admitting: Obstetrics and Gynecology

## 2017-09-13 VITALS — BP 132/82 | HR 102 | Wt 292.2 lb

## 2017-09-13 DIAGNOSIS — O0993 Supervision of high risk pregnancy, unspecified, third trimester: Secondary | ICD-10-CM

## 2017-09-13 DIAGNOSIS — O10913 Unspecified pre-existing hypertension complicating pregnancy, third trimester: Secondary | ICD-10-CM

## 2017-09-13 DIAGNOSIS — Z23 Encounter for immunization: Secondary | ICD-10-CM | POA: Diagnosis not present

## 2017-09-13 DIAGNOSIS — O10919 Unspecified pre-existing hypertension complicating pregnancy, unspecified trimester: Secondary | ICD-10-CM

## 2017-09-13 DIAGNOSIS — O0990 Supervision of high risk pregnancy, unspecified, unspecified trimester: Secondary | ICD-10-CM

## 2017-09-13 MED ORDER — COMFORT FIT MATERNITY SUPP MED MISC: 0 refills | Status: DC

## 2017-09-13 NOTE — Progress Notes (Addendum)
   PRENATAL VISIT NOTE  Subjective:  Gabriella [name] is a 32 y.o. G3P1011 at 536w6d being seen today for ongoing prenatal care.  She is currently monitored for the following issues for this high-risk pregnancy and has Supervision of high-risk pregnancy; Chronic hypertension complicating pregnancy, antepartum; and Elevated hemoglobin A1c on their problem list.  Patient reports pelvic pressure.  Contractions: Not present. Vag. Bleeding: None.  Movement: Present. Denies leaking of fluid.   The following portions of the patient's history were reviewed and updated as appropriate: allergies, current medications, past family history, past medical history, past social history, past surgical history and problem list. Problem list updated.  Objective:   Vitals:   09/13/17 0910  BP: 132/82  Pulse: (!) 102  Weight: 292 lb 3.2 oz (132.5 kg)    Fetal Status: Fetal Heart Rate (bpm): 135 Fundal Height: 28 cm Movement: Present     General:  Alert, oriented and cooperative. Patient is in no acute distress.  Skin: Skin is warm and dry. No rash noted.   Cardiovascular: Normal heart rate noted  Respiratory: Normal respiratory effort, no problems with respiration noted  Abdomen: Soft, gravid, appropriate for gestational age.  Pain/Pressure: Absent     Pelvic: Cervical exam deferred        Extremities: Normal range of motion.  Edema: None  Mental Status:  Normal mood and affect. Normal behavior. Normal judgment and thought content.   Assessment and Plan:  Pregnancy: G3P1011 at 6336w6d  1. Supervision of high risk pregnancy, antepartum Patient is doing well without complaints Reassurance provided regarding pelvic pain. Rx maternity belt provided. Patient also instructed to stretch Third trimester labs and glucola today - Glucose Tolerance, 2 Hours w/1 Hour - CBC - HIV antibody - RPR  2. Chronic hypertension complicating pregnancy, antepartum Normotensive without medication Continue ASA Normal  growth on 3/13 Follow up growth on 4/10 Antenatal testing starting at 32 weeks - US MFM FETAL BPP WO NON STRESS; Future  Preterm labor symptoms and general obstetric precautions including but not limited to vaginal bleeding, contractions, leaking of fluid and fetal movement were reviewed in detail with the patient. Please refer to After Visit Summary for other counseling recommendations.  Return in about 2 weeks (around 09/27/2017) for ROB.   Catalina AntiguaPeggy Marceline Napierala, MD

## 2017-09-14 ENCOUNTER — Other Ambulatory Visit: Payer: Self-pay | Admitting: Obstetrics and Gynecology

## 2017-09-14 ENCOUNTER — Telehealth: Payer: Self-pay | Admitting: Pediatrics

## 2017-09-14 ENCOUNTER — Encounter: Payer: Self-pay | Admitting: Obstetrics and Gynecology

## 2017-09-14 DIAGNOSIS — O24419 Gestational diabetes mellitus in pregnancy, unspecified control: Secondary | ICD-10-CM | POA: Insufficient documentation

## 2017-09-14 DIAGNOSIS — O24410 Gestational diabetes mellitus in pregnancy, diet controlled: Secondary | ICD-10-CM

## 2017-09-14 LAB — CBC
HEMATOCRIT: 28.9 % — AB (ref 34.0–46.6)
HEMOGLOBIN: 8.7 g/dL — AB (ref 11.1–15.9)
MCH: 20.8 pg — ABNORMAL LOW (ref 26.6–33.0)
MCHC: 30.1 g/dL — ABNORMAL LOW (ref 31.5–35.7)
MCV: 69 fL — AB (ref 79–97)
Platelets: 275 10*3/uL (ref 150–379)
RBC: 4.19 x10E6/uL (ref 3.77–5.28)
RDW: 20.8 % — AB (ref 12.3–15.4)
WBC: 9.8 10*3/uL (ref 3.4–10.8)

## 2017-09-14 LAB — GLUCOSE TOLERANCE, 2 HOURS W/ 1HR
Glucose, 1 hour: 133 mg/dL (ref 65–179)
Glucose, 2 hour: 113 mg/dL (ref 65–152)
Glucose, Fasting: 93 mg/dL — ABNORMAL HIGH (ref 65–91)

## 2017-09-14 LAB — HIV ANTIBODY (ROUTINE TESTING W REFLEX): HIV Screen 4th Generation wRfx: NONREACTIVE

## 2017-09-14 LAB — RPR: RPR Ser Ql: NONREACTIVE

## 2017-09-14 MED ORDER — ACCU-CHEK MULTICLIX LANCETS MISC: 12 refills | Status: DC

## 2017-09-14 MED ORDER — GLUCOSE BLOOD VI STRP: ORAL_STRIP | 12 refills | Status: DC

## 2017-09-14 MED ORDER — ACCU-CHEK GUIDE W/DEVICE KIT: 1.0000 | PACK | 0 refills | Status: DC

## 2017-09-14 NOTE — Telephone Encounter (Signed)
-----   Message from Catalina AntiguaPeggy Constant, MD sent at 09/14/2017  8:17 AM EDT ----- Please inform patient of failed 2 hour glucola and diagnosis of GDM. Referral to see diabetic educator in [employer]  Thanks  Peggy

## 2017-09-14 NOTE — Telephone Encounter (Signed)
Pt advised of results, recommendations, and referral. She agrees with plan.  Testing supplies sent to pharmacy.

## 2017-09-17 ENCOUNTER — Encounter: Payer: Self-pay | Admitting: *Deleted

## 2017-09-20 ENCOUNTER — Encounter: Payer: Medicaid Other | Admitting: Obstetrics & Gynecology

## 2017-09-27 ENCOUNTER — Ambulatory Visit (INDEPENDENT_AMBULATORY_CARE_PROVIDER_SITE_OTHER): Payer: Medicaid Other | Admitting: Obstetrics and Gynecology

## 2017-09-27 ENCOUNTER — Encounter: Payer: Self-pay | Admitting: Obstetrics and Gynecology

## 2017-09-27 VITALS — BP 130/85 | HR 106 | Wt 291.4 lb

## 2017-09-27 DIAGNOSIS — O24419 Gestational diabetes mellitus in pregnancy, unspecified control: Secondary | ICD-10-CM

## 2017-09-27 DIAGNOSIS — O0990 Supervision of high risk pregnancy, unspecified, unspecified trimester: Secondary | ICD-10-CM

## 2017-09-27 DIAGNOSIS — O10919 Unspecified pre-existing hypertension complicating pregnancy, unspecified trimester: Secondary | ICD-10-CM

## 2017-09-27 NOTE — Progress Notes (Signed)
   PRENATAL VISIT NOTE  Subjective:  Gabriella [name] is a 32 y.o. G3P1011 at 73w6dbeing seen today for ongoing prenatal care.  She is currently monitored for the following issues for this high-risk pregnancy and has Supervision of high-risk pregnancy; Chronic hypertension complicating pregnancy, antepartum; Elevated hemoglobin A1c; and Gestational diabetes mellitus (GDM) affecting pregnancy, antepartum on their problem list.  Patient reports no complaints.  Contractions: Not present. Vag. Bleeding: None.  Movement: Present. Denies leaking of fluid.   The following portions of the patient's history were reviewed and updated as appropriate: allergies, current medications, past family history, past medical history, past social history, past surgical history and problem list. Problem list updated.  Objective:   Vitals:   09/27/17 1002 09/27/17 1004  BP: (!) 153/83 130/85  Pulse: (!) 106   Weight: 291 lb 6.4 oz (132.2 kg)     Fetal Status: Fetal Heart Rate (bpm): 155 Fundal Height: 30 cm Movement: Present     General:  Alert, oriented and cooperative. Patient is in no acute distress.  Skin: Skin is warm and dry. No rash noted.   Cardiovascular: Normal heart rate noted  Respiratory: Normal respiratory effort, no problems with respiration noted  Abdomen: Soft, gravid, appropriate for gestational age.  Pain/Pressure: Present     Pelvic: Cervical exam deferred        Extremities: Normal range of motion.  Edema: None  Mental Status:  Normal mood and affect. Normal behavior. Normal judgment and thought content.   Assessment and Plan:  Pregnancy: G3P1011 at 262w6d1. Supervision of high risk pregnancy, antepartum Patient is doing well without complaints  2. Gestational diabetes mellitus (GDM) affecting pregnancy, antepartum Patient has not met with diabetic educator yet Referral placed 3/15 Patient has testing supplies  3. Chronic hypertension complicating pregnancy,  antepartum Normotensive without meds Continue ASA Antenatal testing at 32 weeks  Preterm labor symptoms and general obstetric precautions including but not limited to vaginal bleeding, contractions, leaking of fluid and fetal movement were reviewed in detail with the patient. Please refer to After Visit Summary for other counseling recommendations.  Return in about 2 weeks (around 10/11/2017) for ROB.   PeMora BellmanMD

## 2017-10-04 ENCOUNTER — Encounter: Payer: Medicaid Other | Attending: Obstetrics and Gynecology | Admitting: Registered"

## 2017-10-04 ENCOUNTER — Encounter: Payer: Self-pay | Admitting: Registered"

## 2017-10-04 DIAGNOSIS — Z3A00 Weeks of gestation of pregnancy not specified: Secondary | ICD-10-CM | POA: Insufficient documentation

## 2017-10-04 DIAGNOSIS — O24410 Gestational diabetes mellitus in pregnancy, diet controlled: Secondary | ICD-10-CM | POA: Insufficient documentation

## 2017-10-04 DIAGNOSIS — Z713 Dietary counseling and surveillance: Secondary | ICD-10-CM | POA: Insufficient documentation

## 2017-10-04 DIAGNOSIS — O24419 Gestational diabetes mellitus in pregnancy, unspecified control: Secondary | ICD-10-CM

## 2017-10-04 NOTE — Progress Notes (Signed)
Patient was seen on 10/04/2017 for Gestational Diabetes self-management education at the [facility]. The following learning objectives were met by the patient during this course:   States the definition of Gestational Diabetes  States why dietary management is important in controlling blood glucose  Describes the effects each nutrient has on blood glucose levels  Demonstrates ability to create a balanced meal plan  Demonstrates carbohydrate counting   States when to check blood glucose levels  Demonstrates proper blood glucose monitoring techniques  States the effect of stress and exercise on blood glucose levels  States the importance of limiting caffeine and abstaining from alcohol and smoking  Blood glucose monitor given: none, patient has meter  Patient instructed to monitor glucose levels: FBS: 60 - <95; 1 hour: <140; 2 hour: <120  Patient received handouts:  Nutrition Diabetes and Pregnancy, including carb counting list  Patient will be seen for follow-up as needed.

## 2017-10-10 ENCOUNTER — Ambulatory Visit (HOSPITAL_COMMUNITY)
Admission: RE | Admit: 2017-10-10 | Discharge: 2017-10-10 | Disposition: A | Discharge status: Home / Self Care | Payer: Medicaid Other | Source: Ambulatory Visit | Attending: Certified Nurse Midwife | Admitting: Certified Nurse Midwife

## 2017-10-10 ENCOUNTER — Encounter (HOSPITAL_COMMUNITY): Payer: Self-pay

## 2017-10-10 DIAGNOSIS — O09293 Supervision of pregnancy with other poor reproductive or obstetric history, third trimester: Secondary | ICD-10-CM | POA: Insufficient documentation

## 2017-10-10 DIAGNOSIS — O10013 Pre-existing essential hypertension complicating pregnancy, third trimester: Secondary | ICD-10-CM | POA: Insufficient documentation

## 2017-10-10 DIAGNOSIS — Z3A31 31 weeks gestation of pregnancy: Secondary | ICD-10-CM | POA: Diagnosis not present

## 2017-10-10 DIAGNOSIS — O99213 Obesity complicating pregnancy, third trimester: Secondary | ICD-10-CM | POA: Insufficient documentation

## 2017-10-10 DIAGNOSIS — O10919 Unspecified pre-existing hypertension complicating pregnancy, unspecified trimester: Secondary | ICD-10-CM

## 2017-10-11 ENCOUNTER — Ambulatory Visit (INDEPENDENT_AMBULATORY_CARE_PROVIDER_SITE_OTHER): Payer: Medicaid Other | Admitting: Obstetrics and Gynecology

## 2017-10-11 ENCOUNTER — Encounter: Payer: Self-pay | Admitting: Obstetrics and Gynecology

## 2017-10-11 VITALS — BP 128/86 | HR 102 | Wt 290.6 lb

## 2017-10-11 DIAGNOSIS — O0990 Supervision of high risk pregnancy, unspecified, unspecified trimester: Secondary | ICD-10-CM

## 2017-10-11 DIAGNOSIS — O24419 Gestational diabetes mellitus in pregnancy, unspecified control: Secondary | ICD-10-CM

## 2017-10-11 DIAGNOSIS — O10919 Unspecified pre-existing hypertension complicating pregnancy, unspecified trimester: Secondary | ICD-10-CM

## 2017-10-11 NOTE — Progress Notes (Signed)
   PRENATAL VISIT NOTE  Subjective:  Gabriella [name] is a 32 y.o. G3P1011 at 2993w6d being seen today for ongoing prenatal care.  She is currently monitored for the following issues for this high-risk pregnancy and has Supervision of high-risk pregnancy; Chronic hypertension complicating pregnancy, antepartum; Elevated hemoglobin A1c; and Gestational diabetes mellitus (GDM) affecting pregnancy, antepartum on their problem list.  Patient reports no complaints.  Contractions: Not present. Vag. Bleeding: None.  Movement: Present. Denies leaking of fluid.   The following portions of the patient's history were reviewed and updated as appropriate: allergies, current medications, past family history, past medical history, past social history, past surgical history and problem list. Problem list updated.  Objective:   Vitals:   10/11/17 0853  BP: 128/86  Pulse: (!) 102  Weight: 290 lb 9.6 oz (131.8 kg)    Fetal Status: Fetal Heart Rate (bpm): 133 Fundal Height: 32 cm Movement: Present     General:  Alert, oriented and cooperative. Patient is in no acute distress.  Skin: Skin is warm and dry. No rash noted.   Cardiovascular: Normal heart rate noted  Respiratory: Normal respiratory effort, no problems with respiration noted  Abdomen: Soft, gravid, appropriate for gestational age.  Pain/Pressure: Present     Pelvic: Cervical exam deferred        Extremities: Normal range of motion.  Edema: None  Mental Status: Normal mood and affect. Normal behavior. Normal judgment and thought content.   Assessment and Plan:  Pregnancy: G3P1011 at 2593w6d  1. Supervision of high risk pregnancy, antepartum Patient is doing well without complaints  2. Gestational diabetes mellitus (GDM) affecting pregnancy, antepartum CBGs reviewed and all pp within range. Fasting values 92-105. Discussed consuming a protein rich snack at bedtime Encouraged her to continue her efforts  3. Chronic hypertension complicating  pregnancy, antepartum Normotensive without medication Continue asa Antenatal testing next week  Preterm labor symptoms and general obstetric precautions including but not limited to vaginal bleeding, contractions, leaking of fluid and fetal movement were reviewed in detail with the patient. Please refer to After Visit Summary for other counseling recommendations.  Return in about 1 week (around 10/18/2017) for ROB, NST, AFI.  Future Appointments  Date Time Provider Department Center  11/07/2017  9:00 AM [facility] US 3 [facility]    Catalina AntiguaPeggy Wealthy Danielski, MD

## 2017-10-13 ENCOUNTER — Inpatient Hospital Stay (HOSPITAL_COMMUNITY)
Admission: AD | Admit: 2017-10-13 | Discharge: 2017-10-13 | Disposition: A | Discharge status: Home / Self Care | Payer: Medicaid Other | Source: Ambulatory Visit | Attending: Obstetrics and Gynecology | Admitting: Obstetrics and Gynecology

## 2017-10-13 ENCOUNTER — Encounter (HOSPITAL_COMMUNITY): Payer: Self-pay | Admitting: *Deleted

## 2017-10-13 DIAGNOSIS — M545 Low back pain: Secondary | ICD-10-CM | POA: Diagnosis not present

## 2017-10-13 DIAGNOSIS — O9989 Other specified diseases and conditions complicating pregnancy, childbirth and the puerperium: Secondary | ICD-10-CM | POA: Diagnosis not present

## 2017-10-13 DIAGNOSIS — B9689 Other specified bacterial agents as the cause of diseases classified elsewhere: Secondary | ICD-10-CM | POA: Diagnosis not present

## 2017-10-13 DIAGNOSIS — Z3A32 32 weeks gestation of pregnancy: Secondary | ICD-10-CM | POA: Insufficient documentation

## 2017-10-13 DIAGNOSIS — O23593 Infection of other part of genital tract in pregnancy, third trimester: Secondary | ICD-10-CM | POA: Insufficient documentation

## 2017-10-13 DIAGNOSIS — Z87891 Personal history of nicotine dependence: Secondary | ICD-10-CM | POA: Diagnosis not present

## 2017-10-13 DIAGNOSIS — N760 Acute vaginitis: Secondary | ICD-10-CM | POA: Diagnosis not present

## 2017-10-13 DIAGNOSIS — O4703 False labor before 37 completed weeks of gestation, third trimester: Secondary | ICD-10-CM

## 2017-10-13 DIAGNOSIS — O4693 Antepartum hemorrhage, unspecified, third trimester: Secondary | ICD-10-CM | POA: Diagnosis present

## 2017-10-13 LAB — URINALYSIS, ROUTINE W REFLEX MICROSCOPIC
BILIRUBIN URINE: NEGATIVE
Glucose, UA: NEGATIVE mg/dL
KETONES UR: NEGATIVE mg/dL
NITRITE: NEGATIVE
Protein, ur: NEGATIVE mg/dL
SPECIFIC GRAVITY, URINE: 1.011 (ref 1.005–1.030)
pH: 7 (ref 5.0–8.0)

## 2017-10-13 LAB — WET PREP, GENITAL
Sperm: NONE SEEN
Trich, Wet Prep: NONE SEEN
YEAST WET PREP: NONE SEEN

## 2017-10-13 MED ORDER — METRONIDAZOLE 500 MG PO TABS: 500.0000 mg | ORAL_TABLET | Freq: Two times a day (BID) | ORAL | 0 refills | Status: DC

## 2017-10-13 NOTE — Discharge Instructions (Signed)
Bacterial Vaginosis °Bacterial vaginosis is a vaginal infection that occurs when the normal balance of bacteria in the vagina is disrupted. It results from an overgrowth of certain bacteria. This is the most common vaginal infection among women ages 15-44. °Because bacterial vaginosis increases your risk for STIs (sexually transmitted infections), getting treated can help reduce your risk for chlamydia, gonorrhea, herpes, and HIV (human immunodeficiency virus). Treatment is also important for preventing complications in pregnant women, because this condition can cause an early (premature) delivery. °What are the causes? °This condition is caused by an increase in harmful bacteria that are normally present in small amounts in the vagina. However, the reason that the condition develops is not fully understood. °What increases the risk? °The following factors may make you more likely to develop this condition: °· Having a new sexual partner or multiple sexual partners. °· Having unprotected sex. °· Douching. °· Having an intrauterine device (IUD). °· Smoking. °· Drug and alcohol abuse. °· Taking certain antibiotic medicines. °· Being pregnant. ° °You cannot get bacterial vaginosis from toilet seats, bedding, swimming pools, or contact with objects around you. °What are the signs or symptoms? °Symptoms of this condition include: °· Grey or white vaginal discharge. The discharge can also be watery or foamy. °· A fish-like odor with discharge, especially after sexual intercourse or during menstruation. °· Itching in and around the vagina. °· Burning or pain with urination. ° °Some women with bacterial vaginosis have no signs or symptoms. °How is this diagnosed? °This condition is diagnosed based on: °· Your medical history. °· A physical exam of the vagina. °· Testing a sample of vaginal fluid under a microscope to look for a large amount of bad bacteria or abnormal cells. Your health care provider may use a cotton swab  or a small wooden spatula to collect the sample. ° °How is this treated? °This condition is treated with antibiotics. These may be given as a pill, a vaginal cream, or a medicine that is put into the vagina (suppository). If the condition comes back after treatment, a second round of antibiotics may be needed. °Follow these instructions at home: °Medicines °· Take over-the-counter and prescription medicines only as told by your health care provider. °· Take or use your antibiotic as told by your health care provider. Do not stop taking or using the antibiotic even if you start to feel better. °General instructions °· If you have a female sexual partner, tell her that you have a vaginal infection. She should see her health care provider and be treated if she has symptoms. If you have a female sexual partner, he does not need treatment. °· During treatment: °? Avoid sexual activity until you finish treatment. °? Do not douche. °? Avoid alcohol as directed by your health care provider. °? Avoid breastfeeding as directed by your health care provider. °· Drink enough water and fluids to keep your urine clear or pale yellow. °· Keep the area around your vagina and rectum clean. °? Wash the area daily with warm water. °? Wipe yourself from front to back after using the toilet. °· Keep all follow-up visits as told by your health care provider. This is important. °How is this prevented? °· Do not douche. °· Wash the outside of your vagina with warm water only. °· Use protection when having sex. This includes latex condoms and dental dams. °· Limit how many sexual partners you have. To help prevent bacterial vaginosis, it is best to have sex with just   one partner (monogamous). °· Make sure you and your sexual partner are tested for STIs. °· Wear cotton or cotton-lined underwear. °· Avoid wearing tight pants and pantyhose, especially during summer. °· Limit the amount of alcohol that you drink. °· Do not use any products that  contain nicotine or tobacco, such as cigarettes and e-cigarettes. If you need help quitting, ask your health care provider. °· Do not use illegal drugs. °Where to find more information: °· Centers for Disease Control and Prevention: www.cdc.gov/std °· American Sexual Health Association (ASHA): www.ashastd.org °· U.S. Department of Health and Human Services, Office on Women's Health: www.womenshealth.gov/ or https://www.womenshealth.gov/a-z-topics/bacterial-vaginosis °Contact a health care provider if: °· Your symptoms do not improve, even after treatment. °· You have more discharge or pain when urinating. °· You have a fever. °· You have pain in your abdomen. °· You have pain during sex. °· You have vaginal bleeding between periods. °Summary °· Bacterial vaginosis is a vaginal infection that occurs when the normal balance of bacteria in the vagina is disrupted. °· Because bacterial vaginosis increases your risk for STIs (sexually transmitted infections), getting treated can help reduce your risk for chlamydia, gonorrhea, herpes, and HIV (human immunodeficiency virus). Treatment is also important for preventing complications in pregnant women, because the condition can cause an early (premature) delivery. °· This condition is treated with antibiotic medicines. These may be given as a pill, a vaginal cream, or a medicine that is put into the vagina (suppository). °This information is not intended to replace advice given to you by your health care provider. Make sure you discuss any questions you have with your health care provider. °Document Released: 06/19/2005 Document Revised: 10/23/2016 Document Reviewed: 03/04/2016 °Elsevier Interactive Patient Education © 2018 [employer] ° ° °Vaginal Bleeding During Pregnancy, Third Trimester °A small amount of bleeding (spotting) from the vagina is relatively common in pregnancy. Various things can cause bleeding or spotting in pregnancy. Sometimes the bleeding is normal and  is not a problem. However, bleeding during the third trimester can also be a sign of something serious for the mother and the baby. Be sure to tell your health care provider about any vaginal bleeding right away. °Some possible causes of vaginal bleeding during the third trimester include: °· The placenta may be partially or completely covering the opening to the cervix (placenta previa). °· The placenta may have separated from the uterus (abruption of the placenta). °· There may be an infection or growth on the cervix. °· You may be starting labor, called discharging of the mucus plug. °· The placenta may grow into the muscle layer of the uterus (placenta accreta). ° °Follow these instructions at home: °Watch your condition for any changes. The following actions may help to lessen any discomfort you are feeling: °· Follow your health care provider's instructions for limiting your activity. If your health care provider orders bed rest, you may need to stay in bed and only get up to use the bathroom. However, your health care provider may allow you to continue light activity. °· If needed, make plans for someone to help with your regular activities and responsibilities while you are on bed rest. °· Keep track of the number of pads you use each day, how often you change pads, and how soaked (saturated) they are. Write this down. °· Do not use tampons. Do not douche. °· Do not have sexual intercourse or orgasms until approved by your health care provider. °· Follow your health care provider's advice about   lifting, driving, and physical activities. °· If you pass any tissue from your vagina, save the tissue so you can show it to your health care provider. °· Only take over-the-counter or prescription medicines as directed by your health care provider. °· Do not take aspirin because it can make you bleed. °· Keep all follow-up appointments as directed by your health care provider. ° °Contact a health care provider  if: °· You have any vaginal bleeding during any part of your pregnancy. °· You have cramps or labor pains. °· You have a fever, not controlled by medicine. °Get help right away if: °· You have severe cramps or pain in your back or belly (abdomen). °· You have chills. °· You have a gush of fluid from the vagina. °· You pass large clots or tissue from your vagina. °· Your bleeding increases. °· You feel light-headed or weak. °· You pass out. °· You feel less movement or no movement of the baby. °This information is not intended to replace advice given to you by your health care provider. Make sure you discuss any questions you have with your health care provider. °Document Released: 09/09/2002 Document Revised: 11/25/2015 Document Reviewed: 02/24/2013 °Elsevier Interactive Patient Education © 2018 Elsevier Inc. ° °

## 2017-10-13 NOTE — MAU Note (Signed)
Pain in low back started 2 days ago, pressure comes and goes, comes around to upper legs.  This morning noted blood when woke up.  No prior bleeding in preg, no recent intercourse.  No placental issues noted on US per pt.

## 2017-10-13 NOTE — MAU Provider Note (Signed)
History     CSN: [id number]  Arrival date and time: 10/13/17 0756   First Provider Initiated Contact with Patient 10/13/17 [phone]      Chief Complaint  Patient presents with  . Back Pain  . Vaginal Bleeding   HPI  Gabriella [name] is a 32 y.o. G3P1011 at 56w1dwho presents with back pain and vaginal bleeding. Reports low back pain x 2 days that is intermittent. Rates pain 4/10. Has not treated symptoms. Nothing makes pain better or worse. Noticed bright red spotting on toilet paper this morning. Not passing clots or bleeding into pad. Denies recent intercourse. Denies abdominal pain, LOF, or vaginal discharge. Positive fetal movement. No abdominal trauma, MVA, or fall.   OB History    Gravida  3   Para  1   Term  1   Preterm      AB  1   Living  1     SAB  1   TAB      Ectopic      Multiple      Live Births  1           Past Medical History:  Diagnosis Date  . Abscess   . Anemia   . Hypertension     Past Surgical History:  Procedure Laterality Date  . CHOLECYSTECTOMY      Family History  Problem Relation Age of Onset  . Hypertension Mother   . Asthma Mother   . Hypertension Father   . Diabetes Father   . Anemia Sister   . Heart disease Maternal Uncle   . Cancer Maternal Grandmother 32       Throat  . Diabetes Paternal Grandmother   . Kidney disease Paternal Grandmother   . Stroke Paternal Grandfather     Social History   Tobacco Use  . Smoking status: Former Smoker    Last attempt to quit: 05/28/2013    Years since quitting: 4.3  . Smokeless tobacco: Never Used  Substance Use Topics  . Alcohol use: No  . Drug use: No    Allergies:  Allergies  Allergen Reactions  . Doxycycline Nausea And Vomiting    Medications Prior to Admission  Medication Sig Dispense Refill Last Dose  . aspirin 81 MG chewable tablet Chew 1 tablet (81 mg total) by mouth daily. 30 tablet 12 Taking  . Blood Glucose Monitoring Suppl (ACCU-CHEK GUIDE) w/Device  KIT 1 kit by Does not apply route as directed. 1 kit 0 Taking  . Elastic Bandages & Supports (COMFORT FIT MATERNITY SUPP MED) MISC Wear daily when ambulating 1 each 0 Taking  . glucose blood (ACCU-CHEK GUIDE) test strip Use as instructed 100 each 12 Taking  . Lancets (ACCU-CHEK MULTICLIX) lancets Use as instructed 100 each 12 Taking  . Prenat-FeAsp-Meth-FA-DHA w/o A (PRENATE PIXIE) 10-0.6-0.4-200 MG CAPS Take 1 tablet by mouth daily. 30 capsule 12 Taking  . Prenatal Vit-Fe Fumarate-FA (MULTIVITAMIN-PRENATAL) 27-0.8 MG TABS tablet Take 1 tablet by mouth daily at 12 noon.   Taking    Review of Systems  Constitutional: Negative.   Gastrointestinal: Negative.   Genitourinary: Positive for vaginal bleeding.  Musculoskeletal: Positive for back pain.   Physical Exam   Blood pressure 134/75, pulse 85, temperature (!) 97.5 F (36.4 C), temperature source Oral, resp. rate 18, weight 288 lb (130.6 kg), last menstrual period 03/02/2017, SpO2 100 %, unknown if currently breastfeeding.  Physical Exam  Nursing note and vitals reviewed. Constitutional: She is oriented to  person, place, and time. She appears well-developed and well-nourished. No distress.  HENT:  Head: Normocephalic and atraumatic.  Eyes: Conjunctivae are normal. Right eye exhibits no discharge. Left eye exhibits no discharge. No scleral icterus.  Neck: Normal range of motion.  Respiratory: Effort normal. No respiratory distress.  GI: Soft. There is no tenderness.  Genitourinary: Cervix exhibits friability.  Genitourinary Comments: Dilation: Closed Exam by:: Jorje Guild, NP   Neurological: She is alert and oriented to person, place, and time.  Skin: Skin is warm and dry. She is not diaphoretic.  Psychiatric: She has a normal mood and affect. Her behavior is normal. Judgment and thought content normal.    MAU Course  Procedures Results for orders placed or performed during the hospital encounter of 10/13/17 (from the past 24  hour(s))  Urinalysis, Routine w reflex microscopic     Status: Abnormal   Collection Time: 10/13/17  8:09 AM  Result Value Ref Range   Color, Urine YELLOW YELLOW   APPearance CLEAR CLEAR   Specific Gravity, Urine 1.011 1.005 - 1.030   pH 7.0 5.0 - 8.0   Glucose, UA NEGATIVE NEGATIVE mg/dL   Hgb urine dipstick SMALL (A) NEGATIVE   Bilirubin Urine NEGATIVE NEGATIVE   Ketones, ur NEGATIVE NEGATIVE mg/dL   Protein, ur NEGATIVE NEGATIVE mg/dL   Nitrite NEGATIVE NEGATIVE   Leukocytes, UA LARGE (A) NEGATIVE   RBC / HPF 0-5 0 - 5 RBC/hpf   WBC, UA 6-30 0 - 5 WBC/hpf   Bacteria, UA RARE (A) NONE SEEN   Squamous Epithelial / LPF 0-5 (A) NONE SEEN   Mucus PRESENT   Wet prep, genital     Status: Abnormal   Collection Time: 10/13/17  8:55 AM  Result Value Ref Range   Yeast Wet Prep HPF POC NONE SEEN NONE SEEN   Trich, Wet Prep NONE SEEN NONE SEEN   Clue Cells Wet Prep HPF POC PRESENT (A) NONE SEEN   WBC, Wet Prep HPF POC MANY (A) NONE SEEN   Sperm NONE SEEN     MDM NST:  Baseline: 135 bpm, Variability: Good {> 6 bpm), Accelerations: Reactive and Decelerations: Absent Friable cervix -- cervix source of bleeding. Reactive NST with irregular contractions & closed cervix. Pt blood type is O positive.  Wet prep & GC/CT collected Assessment and Plan  A: 1. BV (bacterial vaginosis)   2. [redacted] weeks gestation of pregnancy   3. Preterm uterine contractions in third trimester, antepartum    P: Discharge home Rx flagyl GC/CT pending Discussed reasons to return to MAU Keep f/u with OB   Jorje Guild 10/13/2017, 8:35 AM

## 2017-10-14 LAB — CULTURE, OB URINE

## 2017-10-15 LAB — GC/CHLAMYDIA PROBE AMP (~~LOC~~) NOT AT ARMC
Chlamydia: NEGATIVE
Neisseria Gonorrhea: NEGATIVE

## 2017-10-16 ENCOUNTER — Other Ambulatory Visit: Payer: Medicaid Other

## 2017-10-16 ENCOUNTER — Encounter: Payer: Self-pay | Admitting: Obstetrics & Gynecology

## 2017-10-16 ENCOUNTER — Ambulatory Visit: Payer: Medicaid Other

## 2017-10-16 ENCOUNTER — Ambulatory Visit (INDEPENDENT_AMBULATORY_CARE_PROVIDER_SITE_OTHER): Payer: Medicaid Other | Admitting: Obstetrics & Gynecology

## 2017-10-16 VITALS — BP 121/81 | HR 89 | Wt 288.8 lb

## 2017-10-16 DIAGNOSIS — O10919 Unspecified pre-existing hypertension complicating pregnancy, unspecified trimester: Secondary | ICD-10-CM

## 2017-10-16 DIAGNOSIS — O0990 Supervision of high risk pregnancy, unspecified, unspecified trimester: Secondary | ICD-10-CM

## 2017-10-16 DIAGNOSIS — O24419 Gestational diabetes mellitus in pregnancy, unspecified control: Secondary | ICD-10-CM

## 2017-10-16 MED ORDER — METFORMIN HCL 500 MG PO TABS: 500.0000 mg | ORAL_TABLET | Freq: Every day | ORAL | 2 refills | Status: DC

## 2017-10-16 NOTE — Progress Notes (Signed)
Patient reports good fetal movement, denies pain. 

## 2017-10-16 NOTE — Patient Instructions (Signed)

## 2017-10-16 NOTE — Progress Notes (Signed)
   PRENATAL VISIT NOTE  Subjective:  Gabriella [name] is a 32 y.o. G3P1011 at 7277w4d being seen today for ongoing prenatal care.  She is currently monitored for the following issues for this high-risk pregnancy and has Supervision of high-risk pregnancy; Chronic hypertension complicating pregnancy, antepartum; Elevated hemoglobin A1c; and Gestational diabetes mellitus (GDM) affecting pregnancy, antepartum on their problem list.  Patient reports no complaints.  Contractions: Not present. Vag. Bleeding: None.  Movement: Present. Denies leaking of fluid.   The following portions of the patient's history were reviewed and updated as appropriate: allergies, current medications, past family history, past medical history, past social history, past surgical history and problem list. Problem list updated.  Objective:   Vitals:   10/16/17 1029  BP: 121/81  Pulse: 89  Weight: 288 lb 12.8 oz (131 kg)    Fetal Status: Fetal Heart Rate (bpm): NST   Movement: Present     General:  Alert, oriented and cooperative. Patient is in no acute distress.  Skin: Skin is warm and dry. No rash noted.   Cardiovascular: Normal heart rate noted  Respiratory: Normal respiratory effort, no problems with respiration noted  Abdomen: Soft, gravid, appropriate for gestational age.  Pain/Pressure: Absent     Pelvic: Cervical exam deferred        Extremities: Normal range of motion.  Edema: None  Mental Status: Normal mood and affect. Normal behavior. Normal judgment and thought content.   Assessment and Plan:  Pregnancy: G3P1011 at 7177w4d  1. Supervision of high risk pregnancy, antepartum FBS 75% above 100, add metformin - Fetal nonstress test; Future - metFORMIN (GLUCOPHAGE) 500 MG tablet; Take 1 tablet (500 mg total) by mouth daily with supper.  Dispense: 30 tablet; Refill: 2  Preterm labor symptoms and general obstetric precautions including but not limited to vaginal bleeding, contractions, leaking of fluid and  fetal movement were reviewed in detail with the patient. Please refer to After Visit Summary for other counseling recommendations.  Return in about 1 week (around 10/23/2017) for NST AFI.  Future Appointments  Date Time Provider Department Center  10/18/2017  9:00 AM [facility] None  11/07/2017  9:00 AM [facility] US 3 [facility]    Scheryl DarterJames Elin Fenley, MD

## 2017-10-18 ENCOUNTER — Other Ambulatory Visit: Payer: Medicaid Other

## 2017-10-23 ENCOUNTER — Encounter: Payer: Self-pay | Admitting: Obstetrics & Gynecology

## 2017-10-23 ENCOUNTER — Encounter: Payer: Medicaid Other | Admitting: Obstetrics & Gynecology

## 2017-10-23 ENCOUNTER — Ambulatory Visit (INDEPENDENT_AMBULATORY_CARE_PROVIDER_SITE_OTHER): Payer: Medicaid Other | Admitting: Obstetrics & Gynecology

## 2017-10-23 ENCOUNTER — Other Ambulatory Visit: Payer: Medicaid Other

## 2017-10-23 VITALS — BP 136/89 | HR 94 | Wt 289.0 lb

## 2017-10-23 DIAGNOSIS — O26849 Uterine size-date discrepancy, unspecified trimester: Secondary | ICD-10-CM

## 2017-10-23 DIAGNOSIS — O24419 Gestational diabetes mellitus in pregnancy, unspecified control: Secondary | ICD-10-CM

## 2017-10-23 DIAGNOSIS — O10919 Unspecified pre-existing hypertension complicating pregnancy, unspecified trimester: Secondary | ICD-10-CM

## 2017-10-23 DIAGNOSIS — O10913 Unspecified pre-existing hypertension complicating pregnancy, third trimester: Secondary | ICD-10-CM

## 2017-10-23 DIAGNOSIS — O0993 Supervision of high risk pregnancy, unspecified, third trimester: Secondary | ICD-10-CM

## 2017-10-23 DIAGNOSIS — O26843 Uterine size-date discrepancy, third trimester: Secondary | ICD-10-CM

## 2017-10-23 DIAGNOSIS — O0990 Supervision of high risk pregnancy, unspecified, unspecified trimester: Secondary | ICD-10-CM

## 2017-10-23 MED ORDER — METFORMIN HCL 500 MG PO TABS: 1000.0000 mg | ORAL_TABLET | Freq: Every day | ORAL | 2 refills | Status: DC

## 2017-10-23 NOTE — Progress Notes (Signed)
   PRENATAL VISIT NOTE  Subjective:  Gabriella [name] is a 32 y.o. G3P1011 at 7054w4d being seen today for ongoing prenatal care.  She is currently monitored for the following issues for this high-risk pregnancy and has Supervision of high-risk pregnancy; Chronic hypertension complicating pregnancy, antepartum; Elevated hemoglobin A1c; and Gestational diabetes mellitus (GDM) affecting pregnancy, antepartum on their problem list.  Patient reports no complaints.  Contractions: Not present. Vag. Bleeding: None.  Movement: Present. Denies leaking of fluid.   The following portions of the patient's history were reviewed and updated as appropriate: allergies, current medications, past family history, past medical history, past social history, past surgical history and problem list. Problem list updated.  Objective:   Vitals:   10/23/17 0954  BP: 136/89  Pulse: 94  Weight: 289 lb (131.1 kg)    Fetal Status: Fetal Heart Rate (bpm): NST   Movement: Present     General:  Alert, oriented and cooperative. Patient is in no acute distress.  Skin: Skin is warm and dry. No rash noted.   Cardiovascular: Normal heart rate noted  Respiratory: Normal respiratory effort, no problems with respiration noted  Abdomen: Soft, gravid, appropriate for gestational age.  Pain/Pressure: Present     Pelvic: Cervical exam deferred        Extremities: Normal range of motion.  Edema: None  Mental Status: Normal mood and affect. Normal behavior. Normal judgment and thought content.   Assessment and Plan:  Pregnancy: G3P1011 at 7554w4d  1. Supervision of high risk pregnancy in third trimester NST reviewed and reactive.  AFI: 18cm  2. Gestational diabetes mellitus (GDM) affecting pregnancy, antepartum Her fasting glc is still elevated. Will increase her Metformin to 1000mg  qhs.    3. Chronic hypertension complicating pregnancy, antepartum Cont baby ASA  Preterm labor symptoms and general obstetric precautions  including but not limited to vaginal bleeding, contractions, leaking of fluid and fetal movement were reviewed in detail with the patient. Please refer to After Visit Summary for other counseling recommendations.  Return in about 2 weeks (around 11/06/2017).  Future Appointments  Date Time Provider Department Center  11/07/2017  9:00 AM [facility] US 3 [facility]    Willodean Rosenthalarolyn Harraway-Smith, MD

## 2017-10-26 ENCOUNTER — Other Ambulatory Visit: Payer: Medicaid Other

## 2017-11-06 ENCOUNTER — Encounter: Payer: Self-pay | Admitting: Obstetrics and Gynecology

## 2017-11-06 ENCOUNTER — Ambulatory Visit (INDEPENDENT_AMBULATORY_CARE_PROVIDER_SITE_OTHER): Payer: Medicaid Other | Admitting: Obstetrics and Gynecology

## 2017-11-06 VITALS — BP 132/84 | HR 99 | Wt 290.0 lb

## 2017-11-06 DIAGNOSIS — O10913 Unspecified pre-existing hypertension complicating pregnancy, third trimester: Secondary | ICD-10-CM

## 2017-11-06 DIAGNOSIS — O10919 Unspecified pre-existing hypertension complicating pregnancy, unspecified trimester: Secondary | ICD-10-CM

## 2017-11-06 DIAGNOSIS — O0993 Supervision of high risk pregnancy, unspecified, third trimester: Secondary | ICD-10-CM

## 2017-11-06 DIAGNOSIS — O24419 Gestational diabetes mellitus in pregnancy, unspecified control: Secondary | ICD-10-CM

## 2017-11-06 NOTE — Progress Notes (Signed)
   PRENATAL VISIT NOTE  Subjective:  Gabriella [name] is a 32 y.o. G3P1011 at [redacted]w[redacted]d being seen today for ongoing prenatal care.  She is currently monitored for the following issues for this high-risk pregnancy and has Supervision of high-risk pregnancy; Chronic hypertension complicating pregnancy, antepartum; Elevated hemoglobin A1c; and Gestational diabetes mellitus (GDM) affecting pregnancy, antepartum on their problem list.  Patient reports no complaints.  Contractions: Not present. Vag. Bleeding: None.  Movement: Present. Denies leaking of fluid.   The following portions of the patient's history were reviewed and updated as appropriate: allergies, current medications, past family history, past medical history, past social history, past surgical history and problem list. Problem list updated.  Objective:   Vitals:   11/06/17 1116  BP: 132/84  Pulse: 99  Weight: 290 lb (131.5 kg)    Fetal Status: Fetal Heart Rate (bpm): NST    Movement: Present     General:  Alert, oriented and cooperative. Patient is in no acute distress.  Skin: Skin is warm and dry. No rash noted.   Cardiovascular: Normal heart rate noted  Respiratory: Normal respiratory effort, no problems with respiration noted  Abdomen: Soft, gravid, appropriate for gestational age.  Pain/Pressure: Present     Pelvic: Cervical exam deferred        Extremities: Normal range of motion.  Edema: None  Mental Status: Normal mood and affect. Normal behavior. Normal judgment and thought content.   Assessment and Plan:  Pregnancy: G3P1011 at [redacted]w[redacted]d  1. Supervision of high risk pregnancy in third trimester Patient is doing well without complaints Cultures next visit  2. Gestational diabetes mellitus (GDM) affecting pregnancy, antepartum CBGs reviewed and great majority within range. 1 elevated fasting of 99. Advised patient to consume a protein rich snack at bedtime Continue metformin Follow up growth ultrasound  tomorrow Continue antenatal testing NST reviewed and reactive with baseline 140, mod variability, +accels, no decels - [place] MFM FETAL BPP WO NON STRESS; Future - Fetal nonstress test  3. Chronic hypertension complicating pregnancy, antepartum Normotensive without medication Continue ASA - [place] MFM FETAL BPP WO NON STRESS; Future  Preterm labor symptoms and general obstetric precautions including but not limited to vaginal bleeding, contractions, leaking of fluid and fetal movement were reviewed in detail with the patient. Please refer to After Visit Summary for other counseling recommendations.  Return in about 1 week (around 11/13/2017) for ROB, NST, AFI.  Future Appointments  Date Time Provider Department Center  11/07/2017  9:00 AM [facility] [place] 3 [facility]    Catalina Antigua, MD

## 2017-11-07 ENCOUNTER — Other Ambulatory Visit (HOSPITAL_COMMUNITY): Payer: Self-pay | Admitting: Obstetrics and Gynecology

## 2017-11-07 ENCOUNTER — Other Ambulatory Visit (HOSPITAL_COMMUNITY): Payer: Self-pay | Admitting: *Deleted

## 2017-11-07 ENCOUNTER — Ambulatory Visit (HOSPITAL_COMMUNITY)
Admission: RE | Admit: 2017-11-07 | Discharge: 2017-11-07 | Disposition: A | Discharge status: Home / Self Care | Payer: Medicaid Other | Source: Ambulatory Visit | Attending: Certified Nurse Midwife | Admitting: Certified Nurse Midwife

## 2017-11-07 ENCOUNTER — Other Ambulatory Visit: Payer: Self-pay | Admitting: Obstetrics and Gynecology

## 2017-11-07 ENCOUNTER — Encounter (HOSPITAL_COMMUNITY): Payer: Self-pay

## 2017-11-07 DIAGNOSIS — O10919 Unspecified pre-existing hypertension complicating pregnancy, unspecified trimester: Secondary | ICD-10-CM

## 2017-11-07 DIAGNOSIS — O24415 Gestational diabetes mellitus in pregnancy, controlled by oral hypoglycemic drugs: Secondary | ICD-10-CM | POA: Insufficient documentation

## 2017-11-07 DIAGNOSIS — Z3A35 35 weeks gestation of pregnancy: Secondary | ICD-10-CM

## 2017-11-07 DIAGNOSIS — O24419 Gestational diabetes mellitus in pregnancy, unspecified control: Secondary | ICD-10-CM

## 2017-11-07 DIAGNOSIS — O10013 Pre-existing essential hypertension complicating pregnancy, third trimester: Secondary | ICD-10-CM | POA: Insufficient documentation

## 2017-11-07 DIAGNOSIS — O99210 Obesity complicating pregnancy, unspecified trimester: Secondary | ICD-10-CM

## 2017-11-07 DIAGNOSIS — O24410 Gestational diabetes mellitus in pregnancy, diet controlled: Secondary | ICD-10-CM

## 2017-11-07 DIAGNOSIS — Z362 Encounter for other antenatal screening follow-up: Secondary | ICD-10-CM | POA: Insufficient documentation

## 2017-11-07 DIAGNOSIS — O99213 Obesity complicating pregnancy, third trimester: Secondary | ICD-10-CM | POA: Insufficient documentation

## 2017-11-08 ENCOUNTER — Telehealth: Payer: Self-pay

## 2017-11-08 NOTE — Telephone Encounter (Signed)
Returned call and advised pt of updated appt.

## 2017-11-14 ENCOUNTER — Other Ambulatory Visit: Payer: Medicaid Other

## 2017-11-14 ENCOUNTER — Encounter: Payer: Self-pay | Admitting: Obstetrics and Gynecology

## 2017-11-14 ENCOUNTER — Encounter (HOSPITAL_COMMUNITY): Payer: Self-pay

## 2017-11-14 ENCOUNTER — Ambulatory Visit (HOSPITAL_COMMUNITY)
Admission: RE | Admit: 2017-11-14 | Discharge: 2017-11-14 | Disposition: A | Discharge status: Home / Self Care | Payer: Medicaid Other | Source: Ambulatory Visit | Attending: Certified Nurse Midwife | Admitting: Certified Nurse Midwife

## 2017-11-14 ENCOUNTER — Ambulatory Visit (INDEPENDENT_AMBULATORY_CARE_PROVIDER_SITE_OTHER): Payer: Medicaid Other | Admitting: Obstetrics and Gynecology

## 2017-11-14 VITALS — BP 138/91 | HR 116 | Wt 285.8 lb

## 2017-11-14 DIAGNOSIS — O0993 Supervision of high risk pregnancy, unspecified, third trimester: Secondary | ICD-10-CM

## 2017-11-14 DIAGNOSIS — O10919 Unspecified pre-existing hypertension complicating pregnancy, unspecified trimester: Secondary | ICD-10-CM

## 2017-11-14 DIAGNOSIS — O24419 Gestational diabetes mellitus in pregnancy, unspecified control: Secondary | ICD-10-CM

## 2017-11-14 DIAGNOSIS — Z3A36 36 weeks gestation of pregnancy: Secondary | ICD-10-CM | POA: Insufficient documentation

## 2017-11-14 DIAGNOSIS — O10913 Unspecified pre-existing hypertension complicating pregnancy, third trimester: Secondary | ICD-10-CM | POA: Diagnosis not present

## 2017-11-14 NOTE — Progress Notes (Signed)
   PRENATAL VISIT NOTE  Subjective:  Gabriella [name] is a 32 y.o. G3P1011 at [redacted]w[redacted]d being seen today for ongoing prenatal care.  She is currently monitored for the following issues for this high-risk pregnancy and has Supervision of high-risk pregnancy; Chronic hypertension complicating pregnancy, antepartum; Elevated hemoglobin A1c; and Gestational diabetes mellitus (GDM) affecting pregnancy, antepartum on their problem list.  Patient reports no complaints.  Contractions: Irritability. Vag. Bleeding: None.  Movement: Present. Denies leaking of fluid.   The following portions of the patient's history were reviewed and updated as appropriate: allergies, current medications, past family history, past medical history, past social history, past surgical history and problem list. Problem list updated.  Objective:   Vitals:   11/14/17 0905  BP: (!) 138/91  Pulse: (!) 116  Weight: 285 lb 12.8 oz (129.6 kg)    Fetal Status: Fetal Heart Rate (bpm): NST Fundal Height: 37 cm Movement: Present  Presentation: Vertex  General:  Alert, oriented and cooperative. Patient is in no acute distress.  Skin: Skin is warm and dry. No rash noted.   Cardiovascular: Normal heart rate noted  Respiratory: Normal respiratory effort, no problems with respiration noted  Abdomen: Soft, gravid, appropriate for gestational age.  Pain/Pressure: Present     Pelvic: Cervical exam performed Dilation: Fingertip Effacement (%): 50 Station: Ballotable  Extremities: Normal range of motion.  Edema: None  Mental Status: Normal mood and affect. Normal behavior. Normal judgment and thought content.   Assessment and Plan:  Pregnancy: G3P1011 at [redacted]w[redacted]d  1. Supervision of high risk pregnancy in third trimester Patient is doing well without complaints GBS collected - Strep Gp B NAA  2. Gestational diabetes mellitus (GDM) affecting pregnancy, antepartum Patient did not bring log but reports fasting in the 90's and pp no greater  than 120 Continue metformin 5/8 EFW 3087 gm (84%tile) Plan for delivery by 39 weeks Continue antenatal testing NST reviewed and reactive with baseline 145, mod variability, + accels, no deceks  3. Chronic hypertension complicating pregnancy, antepartum Continue monitoring BP Continue ASA  Preterm labor symptoms and general obstetric precautions including but not limited to vaginal bleeding, contractions, leaking of fluid and fetal movement were reviewed in detail with the patient. Please refer to After Visit Summary for other counseling recommendations.  Return in about 1 week (around 11/21/2017) for ROB, NST.  Future Appointments  Date Time Provider Department Center  11/14/2017 11:30 AM [facility] [place] 3 [facility]  11/21/2017  9:30 AM [facility] [place] 1 [facility]  11/22/2017  9:45 AM Hermina Staggers, MD [facility] None  11/28/2017  9:45 AM [facility] [place] 2 [facility]  11/29/2017  9:15 AM Hermina Staggers, MD [facility] None    Catalina Antigua, MD

## 2017-11-14 NOTE — Progress Notes (Signed)
Pt is here for NST/ROB. Pt states she forgot to bring her BS readings, but her fasting BS have been in the 90s- and other readings have been between 90-120.

## 2017-11-16 LAB — STREP GP B NAA: Strep Gp B NAA: NEGATIVE

## 2017-11-21 ENCOUNTER — Encounter (HOSPITAL_COMMUNITY): Payer: Self-pay

## 2017-11-21 ENCOUNTER — Other Ambulatory Visit (HOSPITAL_COMMUNITY): Payer: Self-pay | Admitting: Obstetrics and Gynecology

## 2017-11-21 ENCOUNTER — Other Ambulatory Visit (HOSPITAL_COMMUNITY): Payer: Self-pay | Admitting: *Deleted

## 2017-11-21 ENCOUNTER — Ambulatory Visit (HOSPITAL_COMMUNITY)
Admission: RE | Admit: 2017-11-21 | Discharge: 2017-11-21 | Disposition: A | Discharge status: Home / Self Care | Payer: Medicaid Other | Source: Ambulatory Visit | Attending: Certified Nurse Midwife | Admitting: Certified Nurse Midwife

## 2017-11-21 DIAGNOSIS — Z3A37 37 weeks gestation of pregnancy: Secondary | ICD-10-CM | POA: Insufficient documentation

## 2017-11-21 DIAGNOSIS — O99213 Obesity complicating pregnancy, third trimester: Secondary | ICD-10-CM | POA: Diagnosis not present

## 2017-11-21 DIAGNOSIS — O10919 Unspecified pre-existing hypertension complicating pregnancy, unspecified trimester: Secondary | ICD-10-CM

## 2017-11-21 DIAGNOSIS — O99013 Anemia complicating pregnancy, third trimester: Secondary | ICD-10-CM

## 2017-11-21 DIAGNOSIS — O24415 Gestational diabetes mellitus in pregnancy, controlled by oral hypoglycemic drugs: Secondary | ICD-10-CM | POA: Insufficient documentation

## 2017-11-21 DIAGNOSIS — O10013 Pre-existing essential hypertension complicating pregnancy, third trimester: Secondary | ICD-10-CM | POA: Diagnosis not present

## 2017-11-21 DIAGNOSIS — O24410 Gestational diabetes mellitus in pregnancy, diet controlled: Secondary | ICD-10-CM

## 2017-11-22 ENCOUNTER — Telehealth (HOSPITAL_COMMUNITY): Payer: Self-pay | Admitting: *Deleted

## 2017-11-22 ENCOUNTER — Encounter: Payer: Self-pay | Admitting: Obstetrics and Gynecology

## 2017-11-22 ENCOUNTER — Ambulatory Visit (INDEPENDENT_AMBULATORY_CARE_PROVIDER_SITE_OTHER): Payer: Medicaid Other | Admitting: Obstetrics and Gynecology

## 2017-11-22 VITALS — BP 128/82 | HR 92 | Wt 294.2 lb

## 2017-11-22 DIAGNOSIS — O10919 Unspecified pre-existing hypertension complicating pregnancy, unspecified trimester: Secondary | ICD-10-CM

## 2017-11-22 DIAGNOSIS — O24419 Gestational diabetes mellitus in pregnancy, unspecified control: Secondary | ICD-10-CM | POA: Diagnosis not present

## 2017-11-22 DIAGNOSIS — O0993 Supervision of high risk pregnancy, unspecified, third trimester: Secondary | ICD-10-CM

## 2017-11-22 DIAGNOSIS — O10913 Unspecified pre-existing hypertension complicating pregnancy, third trimester: Secondary | ICD-10-CM | POA: Diagnosis not present

## 2017-11-22 NOTE — Telephone Encounter (Signed)
Preadmission screen  

## 2017-11-22 NOTE — Patient Instructions (Signed)
Vaginal Delivery Vaginal delivery means that you will give birth by pushing your baby out of your birth canal (vagina). A team of health care providers will help you before, during, and after vaginal delivery. Birth experiences are unique for every woman and every pregnancy, and birth experiences vary depending on where you choose to give birth. What should I do to prepare for my baby's birth? Before your baby is born, it is important to talk with your health care provider about:  Your labor and delivery preferences. These may include: ? Medicines that you may be given. ? How you will manage your pain. This might include non-medical pain relief techniques or injectable pain relief such as epidural analgesia. ? How you and your baby will be monitored during labor and delivery. ? Who may be in the labor and delivery room with you. ? Your feelings about surgical delivery of your baby (cesarean delivery, or C-section) if this becomes necessary. ? Your feelings about receiving donated blood through an IV tube (blood transfusion) if this becomes necessary.  Whether you are able: ? To take pictures or videos of the birth. ? To eat during labor and delivery. ? To move around, walk, or change positions during labor and delivery.  What to expect after your baby is born, such as: ? Whether delayed umbilical cord clamping and cutting is offered. ? Who will care for your baby right after birth. ? Medicines or tests that may be recommended for your baby. ? Whether breastfeeding is supported in your hospital or birth center. ? How long you will be in the hospital or birth center.  How any medical conditions you have may affect your baby or your labor and delivery experience.  To prepare for your baby's birth, you should also:  Attend all of your health care visits before delivery (prenatal visits) as recommended by your health care provider. This is important.  Prepare your home for your baby's  arrival. Make sure that you have: ? Diapers. ? Baby clothing. ? Feeding equipment. ? Safe sleeping arrangements for you and your baby.  Install a car seat in your vehicle. Have your car seat checked by a certified car seat installer to make sure that it is installed safely.  Think about who will help you with your new baby at home for at least the first several weeks after delivery.  What can I expect when I arrive at the birth center or hospital? Once you are in labor and have been admitted into the hospital or birth center, your health care provider may:  Review your pregnancy history and any concerns you have.  Insert an IV tube into one of your veins. This is used to give you fluids and medicines.  Check your blood pressure, pulse, temperature, and heart rate (vital signs).  Check whether your bag of water (amniotic sac) has broken (ruptured).  Talk with you about your birth plan and discuss pain control options.  Monitoring Your health care provider may monitor your contractions (uterine monitoring) and your baby's heart rate (fetal monitoring). You may need to be monitored:  Often, but not continuously (intermittently).  All the time or for long periods at a time (continuously). Continuous monitoring may be needed if: ? You are taking certain medicines, such as medicine to relieve pain or make your contractions stronger. ? You have pregnancy or labor complications.  Monitoring may be done by:  Placing a special stethoscope or a handheld monitoring device on your abdomen to   check your baby's heartbeat, and feeling your abdomen for contractions. This method of monitoring does not continuously record your baby's heartbeat or your contractions.  Placing monitors on your abdomen (external monitors) to record your baby's heartbeat and the frequency and length of contractions. You may not have to wear external monitors all the time.  Placing monitors inside of your uterus  (internal monitors) to record your baby's heartbeat and the frequency, length, and strength of your contractions. ? Your health care provider may use internal monitors if he or she needs more information about the strength of your contractions or your baby's heart rate. ? Internal monitors are put in place by passing a thin, flexible wire through your vagina and into your uterus. Depending on the type of monitor, it may remain in your uterus or on your baby's head until birth. ? Your health care provider will discuss the benefits and risks of internal monitoring with you and will ask for your permission before inserting the monitors.  Telemetry. This is a type of continuous monitoring that can be done with external or internal monitors. Instead of having to stay in bed, you are able to move around during telemetry. Ask your health care provider if telemetry is an option for you.  Physical exam Your health care provider may perform a physical exam. This may include:  Checking whether your baby is positioned: ? With the head toward your vagina (head-down). This is most common. ? With the head toward the top of your uterus (head-up or breech). If your baby is in a breech position, your health care provider may try to turn your baby to a head-down position so you can deliver vaginally. If it does not seem that your baby can be born vaginally, your provider may recommend surgery to deliver your baby. In rare cases, you may be able to deliver vaginally if your baby is head-up (breech delivery). ? Lying sideways (transverse). Babies that are lying sideways cannot be delivered vaginally.  Checking your cervix to determine: ? Whether it is thinning out (effacing). ? Whether it is opening up (dilating). ? How low your baby has moved into your birth canal.  What are the three stages of labor and delivery?  Normal labor and delivery is divided into the following three stages: Stage 1  Stage 1 is the  longest stage of labor, and it can last for hours or days. Stage 1 includes: ? Early labor. This is when contractions may be irregular, or regular and mild. Generally, early labor contractions are more than 10 minutes apart. ? Active labor. This is when contractions get longer, more regular, more frequent, and more intense. ? The transition phase. This is when contractions happen very close together, are very intense, and may last longer than during any other part of labor.  Contractions generally feel mild, infrequent, and irregular at first. They get stronger, more frequent (about every 2-3 minutes), and more regular as you progress from early labor through active labor and transition.  Many women progress through stage 1 naturally, but you may need help to continue making progress. If this happens, your health care provider may talk with you about: ? Rupturing your amniotic sac if it has not ruptured yet. ? Giving you medicine to help make your contractions stronger and more frequent.  Stage 1 ends when your cervix is completely dilated to 4 inches (10 cm) and completely effaced. This happens at the end of the transition phase. Stage 2  Once   your cervix is completely effaced and dilated to 4 inches (10 cm), you may start to feel an urge to push. It is common for the body to naturally take a rest before feeling the urge to push, especially if you received an epidural or certain other pain medicines. This rest period may last for up to 1-2 hours, depending on your unique labor experience.  During stage 2, contractions are generally less painful, because pushing helps relieve contraction pain. Instead of contraction pain, you may feel stretching and burning pain, especially when the widest part of your baby's head passes through the vaginal opening (crowning).  Your health care provider will closely monitor your pushing progress and your baby's progress through the vagina during stage 2.  Your  health care provider may massage the area of skin between your vaginal opening and anus (perineum) or apply warm compresses to your perineum. This helps it stretch as the baby's head starts to crown, which can help prevent perineal tearing. ? In some cases, an incision may be made in your perineum (episiotomy) to allow the baby to pass through the vaginal opening. An episiotomy helps to make the opening of the vagina larger to allow more room for the baby to fit through.  It is very important to breathe and focus so your health care provider can control the delivery of your baby's head. Your health care provider may have you decrease the intensity of your pushing, to help prevent perineal tearing.  After delivery of your baby's head, the shoulders and the rest of the body generally deliver very quickly and without difficulty.  Once your baby is delivered, the umbilical cord may be cut right away, or this may be delayed for 1-2 minutes, depending on your baby's health. This may vary among health care providers, hospitals, and birth centers.  If you and your baby are healthy enough, your baby may be placed on your chest or abdomen to help maintain the baby's temperature and to help you bond with each other. Some mothers and babies start breastfeeding at this time. Your health care team will dry your baby and help keep your baby warm during this time.  Your baby may need immediate care if he or she: ? Showed signs of distress during labor. ? Has a medical condition. ? Was born too early (prematurely). ? Had a bowel movement before birth (meconium). ? Shows signs of difficulty transitioning from being inside the uterus to being outside of the uterus. If you are planning to breastfeed, your health care team will help you begin a feeding. Stage 3  The third stage of labor starts immediately after the birth of your baby and ends after you deliver the placenta. The placenta is an organ that develops  during pregnancy to provide oxygen and nutrients to your baby in the womb.  Delivering the placenta may require some pushing, and you may have mild contractions. Breastfeeding can stimulate contractions to help you deliver the placenta.  After the placenta is delivered, your uterus should tighten (contract) and become firm. This helps to stop bleeding in your uterus. To help your uterus contract and to control bleeding, your health care provider may: ? Give you medicine by injection, through an IV tube, by mouth, or through your rectum (rectally). ? Massage your abdomen or perform a vaginal exam to remove any blood clots that are left in your uterus. ? Empty your bladder by placing a thin, flexible tube (catheter) into your bladder. ? Encourage   you to breastfeed your baby. After labor is over, you and your baby will be monitored closely to ensure that you are both healthy until you are ready to go home. Your health care team will teach you how to care for yourself and your baby. This information is not intended to replace advice given to you by your health care provider. Make sure you discuss any questions you have with your health care provider. Document Released: 03/28/2008 Document Revised: 01/07/2016 Document Reviewed: 07/04/2015 Elsevier Interactive Patient Education  2018 [employer]  

## 2017-11-22 NOTE — Progress Notes (Signed)
Subjective:  Gabriella [name] is a 32 y.o. G3P1011 at [redacted]w[redacted]d being seen today for ongoing prenatal care.  She is currently monitored for the following issues for this high-risk pregnancy and has Supervision of high-risk pregnancy; Chronic hypertension complicating pregnancy, antepartum; Elevated hemoglobin A1c; and Gestational diabetes mellitus (GDM) affecting pregnancy, antepartum on their problem list.  Patient reports no complaints.  Contractions: Irritability. Vag. Bleeding: None.  Movement: Present. Denies leaking of fluid.   The following portions of the patient's history were reviewed and updated as appropriate: allergies, current medications, past family history, past medical history, past social history, past surgical history and problem list. Problem list updated.  Objective:   Vitals:   11/22/17 0947  BP: 128/82  Pulse: 92  Weight: 294 lb 3.2 oz (133.4 kg)    Fetal Status:     Movement: Present     General:  Alert, oriented and cooperative. Patient is in no acute distress.  Skin: Skin is warm and dry. No rash noted.   Cardiovascular: Normal heart rate noted  Respiratory: Normal respiratory effort, no problems with respiration noted  Abdomen: Soft, gravid, appropriate for gestational age. Pain/Pressure: Present     Pelvic:  Cervical exam deferred        Extremities: Normal range of motion.  Edema: None  Mental Status: Normal mood and affect. Normal behavior. Normal judgment and thought content.   Urinalysis:      Assessment and Plan:  Pregnancy: G3P1011 at [redacted]w[redacted]d  1. Supervision of high risk pregnancy in third trimester Stable. Labor precautions GBS -  2. GDM RNST CBG's in gaol range Continue with Metformin.  Growth scan next week IOL scheduled for 39 weeks  3. CHTN BP stable RNST today Continue with BASA Growth scan next week IOL scheduled for 39 weeks  Term labor symptoms and general obstetric precautions including but not limited to vaginal bleeding,  contractions, leaking of fluid and fetal movement were reviewed in detail with the patient. Please refer to After Visit Summary for other counseling recommendations.  No follow-ups on file.   Hermina Staggers, MD

## 2017-11-27 ENCOUNTER — Encounter (HOSPITAL_COMMUNITY): Payer: Self-pay | Admitting: *Deleted

## 2017-11-27 ENCOUNTER — Telehealth (HOSPITAL_COMMUNITY): Payer: Self-pay | Admitting: *Deleted

## 2017-11-27 NOTE — Telephone Encounter (Signed)
Preadmission screen  

## 2017-11-28 ENCOUNTER — Ambulatory Visit (HOSPITAL_COMMUNITY)
Admission: RE | Admit: 2017-11-28 | Discharge: 2017-11-28 | Disposition: A | Discharge status: Home / Self Care | Payer: Medicaid Other | Source: Ambulatory Visit | Attending: Certified Nurse Midwife | Admitting: Certified Nurse Midwife

## 2017-11-28 ENCOUNTER — Other Ambulatory Visit (HOSPITAL_COMMUNITY): Payer: Self-pay | Admitting: Obstetrics and Gynecology

## 2017-11-28 ENCOUNTER — Encounter (HOSPITAL_COMMUNITY): Payer: Self-pay

## 2017-11-28 DIAGNOSIS — E669 Obesity, unspecified: Secondary | ICD-10-CM | POA: Insufficient documentation

## 2017-11-28 DIAGNOSIS — O09293 Supervision of pregnancy with other poor reproductive or obstetric history, third trimester: Secondary | ICD-10-CM | POA: Diagnosis not present

## 2017-11-28 DIAGNOSIS — O24410 Gestational diabetes mellitus in pregnancy, diet controlled: Secondary | ICD-10-CM

## 2017-11-28 DIAGNOSIS — O10919 Unspecified pre-existing hypertension complicating pregnancy, unspecified trimester: Secondary | ICD-10-CM

## 2017-11-28 DIAGNOSIS — O10013 Pre-existing essential hypertension complicating pregnancy, third trimester: Secondary | ICD-10-CM | POA: Diagnosis present

## 2017-11-28 DIAGNOSIS — O2693 Pregnancy related conditions, unspecified, third trimester: Secondary | ICD-10-CM | POA: Insufficient documentation

## 2017-11-28 DIAGNOSIS — Z3A38 38 weeks gestation of pregnancy: Secondary | ICD-10-CM | POA: Insufficient documentation

## 2017-11-28 DIAGNOSIS — O24415 Gestational diabetes mellitus in pregnancy, controlled by oral hypoglycemic drugs: Secondary | ICD-10-CM

## 2017-11-28 DIAGNOSIS — O99213 Obesity complicating pregnancy, third trimester: Secondary | ICD-10-CM | POA: Diagnosis not present

## 2017-11-29 ENCOUNTER — Encounter: Payer: Self-pay | Admitting: Obstetrics and Gynecology

## 2017-11-29 ENCOUNTER — Ambulatory Visit (INDEPENDENT_AMBULATORY_CARE_PROVIDER_SITE_OTHER): Payer: Medicaid Other | Admitting: Obstetrics and Gynecology

## 2017-11-29 VITALS — BP 138/83 | HR 88 | Wt 299.0 lb

## 2017-11-29 DIAGNOSIS — O0993 Supervision of high risk pregnancy, unspecified, third trimester: Secondary | ICD-10-CM

## 2017-11-29 DIAGNOSIS — O24419 Gestational diabetes mellitus in pregnancy, unspecified control: Secondary | ICD-10-CM | POA: Diagnosis not present

## 2017-11-29 DIAGNOSIS — O10913 Unspecified pre-existing hypertension complicating pregnancy, third trimester: Secondary | ICD-10-CM

## 2017-11-29 DIAGNOSIS — O10919 Unspecified pre-existing hypertension complicating pregnancy, unspecified trimester: Secondary | ICD-10-CM

## 2017-11-29 NOTE — Progress Notes (Signed)
Pt has no concerns today.  Patient brought log of sugars.  Declines Cervix check today. NST today.

## 2017-11-29 NOTE — Progress Notes (Signed)
Subjective:  Gabriella [name] is a 32 y.o. G3P1011 at [redacted]w[redacted]d being seen today for ongoing prenatal care.  She is currently monitored for the following issues for this high-risk pregnancy and has Supervision of high-risk pregnancy; Chronic hypertension complicating pregnancy, antepartum; Elevated hemoglobin A1c; and Gestational diabetes mellitus (GDM) affecting pregnancy, antepartum on their problem list.  Patient reports no complaints.  Contractions: Irritability. Vag. Bleeding: None.  Movement: Present. Denies leaking of fluid.   The following portions of the patient's history were reviewed and updated as appropriate: allergies, current medications, past family history, past medical history, past social history, past surgical history and problem list. Problem list updated.  Objective:   Vitals:   11/29/17 0900  BP: 138/83  Pulse: 88  Weight: 299 lb (135.6 kg)    Fetal Status: Fetal Heart Rate (bpm): 150/NST    Movement: Present     General:  Alert, oriented and cooperative. Patient is in no acute distress.  Skin: Skin is warm and dry. No rash noted.   Cardiovascular: Normal heart rate noted  Respiratory: Normal respiratory effort, no problems with respiration noted  Abdomen: Soft, gravid, appropriate for gestational age. Pain/Pressure: Present     Pelvic:  Cervical exam deferred      Pt declined  Extremities: Normal range of motion.  Edema: None  Mental Status: Normal mood and affect. Normal behavior. Normal judgment and thought content.   Urinalysis:      Assessment and Plan:  Pregnancy: G3P1011 at [redacted]w[redacted]d  1. Supervision of high risk pregnancy in third trimester Labor precautions IOL scheduled for 12/02/17  2. Gestational diabetes mellitus (GDM) affecting pregnancy, antepartum CBG's in goal range Continue with Metformin Growth scann 11/28/17 > 90%, 9 # 2 oz Has had vag del of 10 # infant in the past without problems Shoulder dystocia and c section discussed with pt RNST  today  3. Chronic hypertension complicating pregnancy, antepartum BP stable RNST today IOL see above  Term labor symptoms and general obstetric precautions including but not limited to vaginal bleeding, contractions, leaking of fluid and fetal movement were reviewed in detail with the patient. Please refer to After Visit Summary for other counseling recommendations.  Return in about 1 month (around 12/27/2017).   Hermina Staggers, MD

## 2017-11-29 NOTE — Patient Instructions (Signed)
Vaginal Delivery Vaginal delivery means that you will give birth by pushing your baby out of your birth canal (vagina). A team of health care providers will help you before, during, and after vaginal delivery. Birth experiences are unique for every woman and every pregnancy, and birth experiences vary depending on where you choose to give birth. What should I do to prepare for my baby's birth? Before your baby is born, it is important to talk with your health care provider about:  Your labor and delivery preferences. These may include: ? Medicines that you may be given. ? How you will manage your pain. This might include non-medical pain relief techniques or injectable pain relief such as epidural analgesia. ? How you and your baby will be monitored during labor and delivery. ? Who may be in the labor and delivery room with you. ? Your feelings about surgical delivery of your baby (cesarean delivery, or C-section) if this becomes necessary. ? Your feelings about receiving donated blood through an IV tube (blood transfusion) if this becomes necessary.  Whether you are able: ? To take pictures or videos of the birth. ? To eat during labor and delivery. ? To move around, walk, or change positions during labor and delivery.  What to expect after your baby is born, such as: ? Whether delayed umbilical cord clamping and cutting is offered. ? Who will care for your baby right after birth. ? Medicines or tests that may be recommended for your baby. ? Whether breastfeeding is supported in your hospital or birth center. ? How long you will be in the hospital or birth center.  How any medical conditions you have may affect your baby or your labor and delivery experience.  To prepare for your baby's birth, you should also:  Attend all of your health care visits before delivery (prenatal visits) as recommended by your health care provider. This is important.  Prepare your home for your baby's  arrival. Make sure that you have: ? Diapers. ? Baby clothing. ? Feeding equipment. ? Safe sleeping arrangements for you and your baby.  Install a car seat in your vehicle. Have your car seat checked by a certified car seat installer to make sure that it is installed safely.  Think about who will help you with your new baby at home for at least the first several weeks after delivery.  What can I expect when I arrive at the birth center or hospital? Once you are in labor and have been admitted into the hospital or birth center, your health care provider may:  Review your pregnancy history and any concerns you have.  Insert an IV tube into one of your veins. This is used to give you fluids and medicines.  Check your blood pressure, pulse, temperature, and heart rate (vital signs).  Check whether your bag of water (amniotic sac) has broken (ruptured).  Talk with you about your birth plan and discuss pain control options.  Monitoring Your health care provider may monitor your contractions (uterine monitoring) and your baby's heart rate (fetal monitoring). You may need to be monitored:  Often, but not continuously (intermittently).  All the time or for long periods at a time (continuously). Continuous monitoring may be needed if: ? You are taking certain medicines, such as medicine to relieve pain or make your contractions stronger. ? You have pregnancy or labor complications.  Monitoring may be done by:  Placing a special stethoscope or a handheld monitoring device on your abdomen to   check your baby's heartbeat, and feeling your abdomen for contractions. This method of monitoring does not continuously record your baby's heartbeat or your contractions.  Placing monitors on your abdomen (external monitors) to record your baby's heartbeat and the frequency and length of contractions. You may not have to wear external monitors all the time.  Placing monitors inside of your uterus  (internal monitors) to record your baby's heartbeat and the frequency, length, and strength of your contractions. ? Your health care provider may use internal monitors if he or she needs more information about the strength of your contractions or your baby's heart rate. ? Internal monitors are put in place by passing a thin, flexible wire through your vagina and into your uterus. Depending on the type of monitor, it may remain in your uterus or on your baby's head until birth. ? Your health care provider will discuss the benefits and risks of internal monitoring with you and will ask for your permission before inserting the monitors.  Telemetry. This is a type of continuous monitoring that can be done with external or internal monitors. Instead of having to stay in bed, you are able to move around during telemetry. Ask your health care provider if telemetry is an option for you.  Physical exam Your health care provider may perform a physical exam. This may include:  Checking whether your baby is positioned: ? With the head toward your vagina (head-down). This is most common. ? With the head toward the top of your uterus (head-up or breech). If your baby is in a breech position, your health care provider may try to turn your baby to a head-down position so you can deliver vaginally. If it does not seem that your baby can be born vaginally, your provider may recommend surgery to deliver your baby. In rare cases, you may be able to deliver vaginally if your baby is head-up (breech delivery). ? Lying sideways (transverse). Babies that are lying sideways cannot be delivered vaginally.  Checking your cervix to determine: ? Whether it is thinning out (effacing). ? Whether it is opening up (dilating). ? How low your baby has moved into your birth canal.  What are the three stages of labor and delivery?  Normal labor and delivery is divided into the following three stages: Stage 1  Stage 1 is the  longest stage of labor, and it can last for hours or days. Stage 1 includes: ? Early labor. This is when contractions may be irregular, or regular and mild. Generally, early labor contractions are more than 10 minutes apart. ? Active labor. This is when contractions get longer, more regular, more frequent, and more intense. ? The transition phase. This is when contractions happen very close together, are very intense, and may last longer than during any other part of labor.  Contractions generally feel mild, infrequent, and irregular at first. They get stronger, more frequent (about every 2-3 minutes), and more regular as you progress from early labor through active labor and transition.  Many women progress through stage 1 naturally, but you may need help to continue making progress. If this happens, your health care provider may talk with you about: ? Rupturing your amniotic sac if it has not ruptured yet. ? Giving you medicine to help make your contractions stronger and more frequent.  Stage 1 ends when your cervix is completely dilated to 4 inches (10 cm) and completely effaced. This happens at the end of the transition phase. Stage 2  Once   your cervix is completely effaced and dilated to 4 inches (10 cm), you may start to feel an urge to push. It is common for the body to naturally take a rest before feeling the urge to push, especially if you received an epidural or certain other pain medicines. This rest period may last for up to 1-2 hours, depending on your unique labor experience.  During stage 2, contractions are generally less painful, because pushing helps relieve contraction pain. Instead of contraction pain, you may feel stretching and burning pain, especially when the widest part of your baby's head passes through the vaginal opening (crowning).  Your health care provider will closely monitor your pushing progress and your baby's progress through the vagina during stage 2.  Your  health care provider may massage the area of skin between your vaginal opening and anus (perineum) or apply warm compresses to your perineum. This helps it stretch as the baby's head starts to crown, which can help prevent perineal tearing. ? In some cases, an incision may be made in your perineum (episiotomy) to allow the baby to pass through the vaginal opening. An episiotomy helps to make the opening of the vagina larger to allow more room for the baby to fit through.  It is very important to breathe and focus so your health care provider can control the delivery of your baby's head. Your health care provider may have you decrease the intensity of your pushing, to help prevent perineal tearing.  After delivery of your baby's head, the shoulders and the rest of the body generally deliver very quickly and without difficulty.  Once your baby is delivered, the umbilical cord may be cut right away, or this may be delayed for 1-2 minutes, depending on your baby's health. This may vary among health care providers, hospitals, and birth centers.  If you and your baby are healthy enough, your baby may be placed on your chest or abdomen to help maintain the baby's temperature and to help you bond with each other. Some mothers and babies start breastfeeding at this time. Your health care team will dry your baby and help keep your baby warm during this time.  Your baby may need immediate care if he or she: ? Showed signs of distress during labor. ? Has a medical condition. ? Was born too early (prematurely). ? Had a bowel movement before birth (meconium). ? Shows signs of difficulty transitioning from being inside the uterus to being outside of the uterus. If you are planning to breastfeed, your health care team will help you begin a feeding. Stage 3  The third stage of labor starts immediately after the birth of your baby and ends after you deliver the placenta. The placenta is an organ that develops  during pregnancy to provide oxygen and nutrients to your baby in the womb.  Delivering the placenta may require some pushing, and you may have mild contractions. Breastfeeding can stimulate contractions to help you deliver the placenta.  After the placenta is delivered, your uterus should tighten (contract) and become firm. This helps to stop bleeding in your uterus. To help your uterus contract and to control bleeding, your health care provider may: ? Give you medicine by injection, through an IV tube, by mouth, or through your rectum (rectally). ? Massage your abdomen or perform a vaginal exam to remove any blood clots that are left in your uterus. ? Empty your bladder by placing a thin, flexible tube (catheter) into your bladder. ? Encourage   you to breastfeed your baby. After labor is over, you and your baby will be monitored closely to ensure that you are both healthy until you are ready to go home. Your health care team will teach you how to care for yourself and your baby. This information is not intended to replace advice given to you by your health care provider. Make sure you discuss any questions you have with your health care provider. Document Released: 03/28/2008 Document Revised: 01/07/2016 Document Reviewed: 07/04/2015 Elsevier Interactive Patient Education  2018 [employer]  

## 2017-12-02 ENCOUNTER — Encounter (HOSPITAL_COMMUNITY): Payer: Self-pay

## 2017-12-02 ENCOUNTER — Inpatient Hospital Stay (HOSPITAL_COMMUNITY): Payer: Medicaid Other | Admitting: Anesthesiology

## 2017-12-02 ENCOUNTER — Other Ambulatory Visit: Payer: Self-pay

## 2017-12-02 ENCOUNTER — Inpatient Hospital Stay (HOSPITAL_COMMUNITY)
Admission: RE | Admit: 2017-12-02 | Discharge: 2017-12-04 | DRG: 806 | Disposition: A | Discharge status: Home / Self Care | Payer: Medicaid Other | Attending: Family Medicine | Admitting: Family Medicine

## 2017-12-02 DIAGNOSIS — D649 Anemia, unspecified: Secondary | ICD-10-CM | POA: Diagnosis present

## 2017-12-02 DIAGNOSIS — Z3A39 39 weeks gestation of pregnancy: Secondary | ICD-10-CM | POA: Diagnosis not present

## 2017-12-02 DIAGNOSIS — O24419 Gestational diabetes mellitus in pregnancy, unspecified control: Secondary | ICD-10-CM | POA: Diagnosis present

## 2017-12-02 DIAGNOSIS — O24425 Gestational diabetes mellitus in childbirth, controlled by oral hypoglycemic drugs: Principal | ICD-10-CM | POA: Diagnosis present

## 2017-12-02 DIAGNOSIS — O1002 Pre-existing essential hypertension complicating childbirth: Secondary | ICD-10-CM | POA: Diagnosis present

## 2017-12-02 DIAGNOSIS — Z87891 Personal history of nicotine dependence: Secondary | ICD-10-CM

## 2017-12-02 DIAGNOSIS — O10919 Unspecified pre-existing hypertension complicating pregnancy, unspecified trimester: Secondary | ICD-10-CM | POA: Diagnosis present

## 2017-12-02 DIAGNOSIS — O9902 Anemia complicating childbirth: Secondary | ICD-10-CM | POA: Diagnosis present

## 2017-12-02 LAB — COMPREHENSIVE METABOLIC PANEL
ALK PHOS: 201 U/L — AB (ref 38–126)
ALT: 20 U/L (ref 14–54)
ANION GAP: 10 (ref 5–15)
AST: 21 U/L (ref 15–41)
Albumin: 2.7 g/dL — ABNORMAL LOW (ref 3.5–5.0)
BUN: 6 mg/dL (ref 6–20)
CALCIUM: 8.4 mg/dL — AB (ref 8.9–10.3)
CO2: 21 mmol/L — ABNORMAL LOW (ref 22–32)
Chloride: 104 mmol/L (ref 101–111)
Creatinine, Ser: 0.52 mg/dL (ref 0.44–1.00)
GFR calc non Af Amer: >60 mL/min (ref >60)
Glucose, Bld: 89 mg/dL (ref 65–99)
Potassium: 3.7 mmol/L (ref 3.5–5.1)
SODIUM: 135 mmol/L (ref 135–145)
TOTAL PROTEIN: 6.6 g/dL (ref 6.5–8.1)
Total Bilirubin: 0.1 mg/dL — ABNORMAL LOW (ref 0.3–1.2)

## 2017-12-02 LAB — PROTEIN / CREATININE RATIO, URINE
Creatinine, Urine: 129 mg/dL
PROTEIN CREATININE RATIO: 0.11 mg/mg{creat} (ref 0.00–0.15)
Total Protein, Urine: 14 mg/dL

## 2017-12-02 LAB — CBC
HEMATOCRIT: 28.2 % — AB (ref 36.0–46.0)
HEMOGLOBIN: 8.1 g/dL — AB (ref 12.0–15.0)
MCH: 20 pg — ABNORMAL LOW (ref 26.0–34.0)
MCHC: 28.7 g/dL — ABNORMAL LOW (ref 30.0–36.0)
MCV: 69.5 fL — AB (ref 78.0–100.0)
Platelets: 229 10*3/uL (ref 150–400)
RBC: 4.06 MIL/uL (ref 3.87–5.11)
RDW: 19.2 % — ABNORMAL HIGH (ref 11.5–15.5)
WBC: 10.7 10*3/uL — AB (ref 4.0–10.5)

## 2017-12-02 LAB — TYPE AND SCREEN
ABO/RH(D): O POS
ANTIBODY SCREEN: NEGATIVE

## 2017-12-02 LAB — GLUCOSE, CAPILLARY: Glucose-Capillary: 68 mg/dL (ref 65–99)

## 2017-12-02 LAB — RPR: RPR Ser Ql: NONREACTIVE

## 2017-12-02 MED ORDER — COCONUT OIL OIL: 1.0000 "application " | TOPICAL_OIL | Status: DC | PRN

## 2017-12-02 MED ORDER — SOD CITRATE-CITRIC ACID 500-334 MG/5ML PO SOLN: 30.0000 mL | ORAL | Status: DC | PRN

## 2017-12-02 MED ORDER — DIBUCAINE 1 % RE OINT: 1.0000 "application " | TOPICAL_OINTMENT | RECTAL | Status: DC | PRN

## 2017-12-02 MED ORDER — ACETAMINOPHEN 325 MG PO TABS: 650.0000 mg | ORAL_TABLET | ORAL | Status: DC | PRN

## 2017-12-02 MED ORDER — LACTATED RINGERS IV SOLN: 500.0000 mL | INTRAVENOUS | Status: DC | PRN

## 2017-12-02 MED ORDER — TETANUS-DIPHTH-ACELL PERTUSSIS 5-2.5-18.5 LF-MCG/0.5 IM SUSP: 0.5000 mL | Freq: Once | INTRAMUSCULAR | Status: DC

## 2017-12-02 MED ORDER — EPHEDRINE 5 MG/ML INJ
10.0000 mg | INTRAVENOUS | Status: DC | PRN
  Filled 2017-12-02: qty 2

## 2017-12-02 MED ORDER — LACTATED RINGERS IV SOLN: 500.0000 mL | Freq: Once | INTRAVENOUS | Status: DC

## 2017-12-02 MED ORDER — PHENYLEPHRINE 40 MCG/ML (10ML) SYRINGE FOR IV PUSH (FOR BLOOD PRESSURE SUPPORT)
80.0000 ug | PREFILLED_SYRINGE | INTRAVENOUS | Status: DC | PRN
  Filled 2017-12-02: qty 5

## 2017-12-02 MED ORDER — LIDOCAINE HCL (PF) 1 % IJ SOLN
INTRAMUSCULAR | Status: DC | PRN
  Administered 2017-12-02 (×2): 4 mL via EPIDURAL

## 2017-12-02 MED ORDER — SENNOSIDES-DOCUSATE SODIUM 8.6-50 MG PO TABS
2.0000 | ORAL_TABLET | ORAL | Status: DC
  Administered 2017-12-02 – 2017-12-04 (×2): 2 via ORAL
  Filled 2017-12-02: qty 2

## 2017-12-02 MED ORDER — OXYTOCIN 40 UNITS IN LACTATED RINGERS INFUSION - SIMPLE MED
1.0000 m[IU]/min | INTRAVENOUS | Status: DC
  Administered 2017-12-02: 2 m[IU]/min via INTRAVENOUS
  Filled 2017-12-02: qty 1000

## 2017-12-02 MED ORDER — WITCH HAZEL-GLYCERIN EX PADS: 1.0000 "application " | MEDICATED_PAD | CUTANEOUS | Status: DC | PRN

## 2017-12-02 MED ORDER — LIDOCAINE HCL (PF) 1 % IJ SOLN
30.0000 mL | INTRAMUSCULAR | Status: DC | PRN
  Filled 2017-12-02: qty 30

## 2017-12-02 MED ORDER — OXYTOCIN 40 UNITS IN LACTATED RINGERS INFUSION - SIMPLE MED: 2.5000 [IU]/h | INTRAVENOUS | Status: DC

## 2017-12-02 MED ORDER — TERBUTALINE SULFATE 1 MG/ML IJ SOLN
0.2500 mg | Freq: Once | INTRAMUSCULAR | Status: DC | PRN
  Filled 2017-12-02: qty 1

## 2017-12-02 MED ORDER — OXYCODONE-ACETAMINOPHEN 5-325 MG PO TABS: 2.0000 | ORAL_TABLET | ORAL | Status: DC | PRN

## 2017-12-02 MED ORDER — FENTANYL CITRATE (PF) 100 MCG/2ML IJ SOLN: 100.0000 ug | INTRAMUSCULAR | Status: DC | PRN

## 2017-12-02 MED ORDER — ZOLPIDEM TARTRATE 5 MG PO TABS: 5.0000 mg | ORAL_TABLET | Freq: Every evening | ORAL | Status: DC | PRN

## 2017-12-02 MED ORDER — BENZOCAINE-MENTHOL 20-0.5 % EX AERO: 1.0000 "application " | INHALATION_SPRAY | CUTANEOUS | Status: DC | PRN

## 2017-12-02 MED ORDER — PHENYLEPHRINE 40 MCG/ML (10ML) SYRINGE FOR IV PUSH (FOR BLOOD PRESSURE SUPPORT)
80.0000 ug | PREFILLED_SYRINGE | INTRAVENOUS | Status: DC | PRN
  Filled 2017-12-02: qty 5
  Filled 2017-12-02: qty 10

## 2017-12-02 MED ORDER — LACTATED RINGERS IV SOLN
INTRAVENOUS | Status: DC
  Administered 2017-12-02: 125 mL/h via INTRAVENOUS
  Administered 2017-12-02: 13:00:00 via INTRAVENOUS

## 2017-12-02 MED ORDER — FLEET ENEMA 7-19 GM/118ML RE ENEM: 1.0000 | ENEMA | RECTAL | Status: DC | PRN

## 2017-12-02 MED ORDER — SIMETHICONE 80 MG PO CHEW: 80.0000 mg | CHEWABLE_TABLET | ORAL | Status: DC | PRN

## 2017-12-02 MED ORDER — ONDANSETRON HCL 4 MG/2ML IJ SOLN: 4.0000 mg | INTRAMUSCULAR | Status: DC | PRN

## 2017-12-02 MED ORDER — ONDANSETRON HCL 4 MG/2ML IJ SOLN: 4.0000 mg | Freq: Four times a day (QID) | INTRAMUSCULAR | Status: DC | PRN

## 2017-12-02 MED ORDER — OXYTOCIN BOLUS FROM INFUSION
500.0000 mL | Freq: Once | INTRAVENOUS | Status: AC
  Administered 2017-12-02: 500 mL via INTRAVENOUS

## 2017-12-02 MED ORDER — PRENATAL MULTIVITAMIN CH
1.0000 | ORAL_TABLET | Freq: Every day | ORAL | Status: DC
  Administered 2017-12-03 – 2017-12-04 (×2): 1 via ORAL
  Filled 2017-12-02 (×2): qty 1

## 2017-12-02 MED ORDER — MISOPROSTOL 25 MCG QUARTER TABLET
25.0000 ug | ORAL_TABLET | ORAL | Status: DC | PRN
  Filled 2017-12-02 (×2): qty 1

## 2017-12-02 MED ORDER — DIPHENHYDRAMINE HCL 50 MG/ML IJ SOLN: 12.5000 mg | INTRAMUSCULAR | Status: DC | PRN

## 2017-12-02 MED ORDER — DIPHENHYDRAMINE HCL 25 MG PO CAPS: 25.0000 mg | ORAL_CAPSULE | Freq: Four times a day (QID) | ORAL | Status: DC | PRN

## 2017-12-02 MED ORDER — IBUPROFEN 600 MG PO TABS
600.0000 mg | ORAL_TABLET | Freq: Four times a day (QID) | ORAL | Status: DC
  Administered 2017-12-02 – 2017-12-04 (×8): 600 mg via ORAL
  Filled 2017-12-02 (×8): qty 1

## 2017-12-02 MED ORDER — ONDANSETRON HCL 4 MG PO TABS: 4.0000 mg | ORAL_TABLET | ORAL | Status: DC | PRN

## 2017-12-02 MED ORDER — OXYCODONE-ACETAMINOPHEN 5-325 MG PO TABS: 1.0000 | ORAL_TABLET | ORAL | Status: DC | PRN

## 2017-12-02 MED ORDER — FENTANYL 2.5 MCG/ML BUPIVACAINE 1/10 % EPIDURAL INFUSION (WH - ANES): 14.0000 mL/h | INTRAMUSCULAR | Status: DC | PRN

## 2017-12-02 MED ORDER — FENTANYL 2.5 MCG/ML BUPIVACAINE 1/10 % EPIDURAL INFUSION (WH - ANES)
14.0000 mL/h | INTRAMUSCULAR | Status: DC | PRN
  Administered 2017-12-02: 14 mL/h via EPIDURAL
  Filled 2017-12-02: qty 100

## 2017-12-02 NOTE — Progress Notes (Signed)
Labor Progress Note  Gabriella FishJessica N [name] is a 32 y.o. G3P1011 at 3144w2d  admitted for induction of labor due to Gestational diabetes; cHTN  S: Doing well. Comfortable with epidural. No concerns.   O:  BP (!) 103/53   Pulse 71   Temp 98.8 F (37.1 C) (Oral)   Resp 18   Ht 5\' 8"  (1.727 m)   Wt 134.3 kg (296 lb 1.6 oz)   LMP 03/02/2017 (Exact Date)   BMI 45.02 kg/m   Total I/O In: -  Out: 75 [Urine:75]  FHT:  FHR: 135 bpm, variability: moderate,  accelerations:  Present,  decelerations:  Absent UC:   regular, every 1-4 minutes SVE:   Dilation: 5 Effacement (%): 60 Station: -2 Exam by:: MD Phelps AROM: light mec,   Pitocin @ 6 mu/min  Labs: Lab Results  Component Value Date   WBC 10.7 (H) 12/02/2017   HGB 8.1 (L) 12/02/2017   HCT 28.2 (L) 12/02/2017   MCV 69.5 (L) 12/02/2017   PLT 229 12/02/2017    Assessment / Plan: 32 y.o. G3P1011 1444w2d in early labor Induction of labor due to gestational diabetes,  progressing well on pitocin  Labor: Progressing normally on Pitocin. AROM'd Fetal Wellbeing:  Category I Pain Control:  Epidural Anticipated MOD:  NSVD  Expectant management   Caryl AdaJazma Phelps, DO OB Fellow Center for [facility], [facility]

## 2017-12-02 NOTE — Lactation Note (Signed)
This note was copied from a baby's chart. Lactation Consultation Note  Patient Name: Gabriella Duanne LimerickJessica Deloney WUJWJ'XToday's Date: 12/02/2017 Reason for consult: Initial assessment;Term  P2 mother whose infant is now 513 hours old.  Mother breastfed her first child for 1 1/2 months.  That child is now 33 years old.  Baby sleeping in bassinet as I arrived.  Reviewed basic breastfeeding concepts with mother since it has been 8 years since she last breastfed.  Encouraged feeding baby 8-12 times/24 hours or earlier if baby shows cues.  Reviewed feeding cues and hand expression with mother.  She believes that her latch may not have been correct with the last feed.  I encouraged her to have her RN assess the latch the next time baby feeds.  Also reminded her to be sure the latch is deep so baby can suck effectively at the breast.  Mother verbalized understanding.  [facility] and has [facility] DEBP for home use.  Mom made aware of O/P services, breastfeeding support groups, community resources, and our phone # for post-discharge questions.  Mother will call for latch assistance as needed tonight.  Mother will be staying alone with baby throughout the night.   Maternal Data Formula Feeding for Exclusion: No Has patient been taught Hand Expression?: Yes Does the patient have breastfeeding experience prior to this delivery?: Yes  Feeding Feeding Type: Breast Fed Length of feed: 20 min  LATCH Score Latch: Grasps breast easily, tongue down, lips flanged, rhythmical sucking.  Audible Swallowing: Spontaneous and intermittent  Type of Nipple: Flat  Comfort (Breast/Nipple): Soft / non-tender  Hold (Positioning): Assistance needed to correctly position infant at breast and maintain latch.  LATCH Score: 8  Interventions Interventions: Breast feeding basics reviewed;Assisted with latch;Skin to skin;Adjust position;Support pillows  Lactation Tools Discussed/Used WIC Program: Yes   Consult  Status Consult Status: Follow-up Date: 12/03/17 Follow-up type: In-patient    Kyce Ging R Kinzy Weyers 12/02/2017, 8:45 PM

## 2017-12-02 NOTE — Anesthesia Pain Management Evaluation Note (Signed)
  CRNA Pain Management Visit Note  Patient: Gabriella [name], 32 y.o., female  "Hello I am a member of the anesthesia team at [facility]. We have an anesthesia team available at all times to provide care throughout the hospital, including epidural management and anesthesia for C-section. I don't know your plan for the delivery whether it a natural birth, water birth, IV sedation, nitrous supplementation, doula or epidural, but we want to meet your pain goals."   1.Was your pain managed to your expectations on prior hospitalizations?   Yes   2.What is your expectation for pain management during this hospitalization?     Epidural  3.How can we help you reach that goal? Epidural in place and working well  Record the patient's initial score and the patient's pain goal.   Pain: 0  Pain Goal: 5 [facility] wants you to be able to say your pain was always managed very well.  Destanie Tibbetts 12/02/2017

## 2017-12-02 NOTE — Anesthesia Preprocedure Evaluation (Signed)
Anesthesia Evaluation  Patient identified by MRN, date of birth, ID band Patient awake    Reviewed: Allergy & Precautions, NPO status , Patient's Chart, lab work & pertinent test results  Airway Mallampati: II  TM Distance: >3 FB Neck ROM: Full    Dental no notable dental hx.    Pulmonary neg pulmonary ROS, former smoker,    Pulmonary exam normal breath sounds clear to auscultation       Cardiovascular hypertension, Normal cardiovascular exam Rhythm:Regular Rate:Normal     Neuro/Psych negative neurological ROS  negative psych ROS   GI/Hepatic negative GI ROS, Neg liver ROS,   Endo/Other  diabetes, Gestational  Renal/GU negative Renal ROS     Musculoskeletal negative musculoskeletal ROS (+)   Abdominal   Peds negative pediatric ROS (+)  Hematology negative hematology ROS (+) anemia ,   Anesthesia Other Findings   Reproductive/Obstetrics (+) Pregnancy                             Anesthesia Physical Anesthesia Plan  ASA: II  Anesthesia Plan: Epidural   Post-op Pain Management:    Induction:   PONV Risk Score and Plan:   Airway Management Planned:   Additional Equipment:   Intra-op Plan:   Post-operative Plan:   Informed Consent: I have reviewed the patients History and Physical, chart, labs and discussed the procedure including the risks, benefits and alternatives for the proposed anesthesia with the patient or authorized representative who has indicated his/her understanding and acceptance.     Plan Discussed with:   Anesthesia Plan Comments:         Anesthesia Quick Evaluation

## 2017-12-02 NOTE — Progress Notes (Signed)
Labor Progress Note  Gabriella [name] is a 32 y.o. G3P1011 at 6286w2d  admitted for induction of labor due to Gestational diabetes; cHTN  S: Doing well. Comfortable with epidural. No concerns.   O:  BP 107/62   Pulse 76   Temp 97.9 F (36.6 C) (Axillary)   Resp 16   Ht 5\' 8"  (1.727 m)   Wt 134.3 kg (296 lb 1.6 oz)   LMP 03/02/2017 (Exact Date)   BMI 45.02 kg/m   Total I/O In: -  Out: 75 [Urine:75]  FHT:  FHR: 135 bpm, variability: moderate,  accelerations:  Present,  decelerations:  Absent UC:   regular, every 2-3 minutes SVE:   Dilation: 8 Effacement (%): 100 Station: 0 Exam by:: Dr Doroteo GlassmanPhelps AROM: light mec    Pitocin @ 8 mu/min  Labs: Lab Results  Component Value Date   WBC 10.7 (H) 12/02/2017   HGB 8.1 (L) 12/02/2017   HCT 28.2 (L) 12/02/2017   MCV 69.5 (L) 12/02/2017   PLT 229 12/02/2017    Assessment / Plan: 32 y.o. G3P1011 3986w2d in early labor Induction of labor due to gestational diabetes,  progressing well on pitocin  Labor: Progressing normally on Pitocin. S/p AROM.  GDM: CBGs wnl.  Fetal Wellbeing:  Category I Pain Control:  Epidural Anticipated MOD:  NSVD  Expectant management   Gabriella AdaJazma Brittane Grudzinski, DO OB Fellow Center for [facility], [facility]

## 2017-12-02 NOTE — H&P (Signed)
Obstetric History and Physical  Gabriella [name] is a 32 y.o. G3P1011 with IUP at 14w2dpresenting for IOL for A2GDM. On metformin '1000mg'$  BID. Prenatal course also complicated by cHTN (not on medications). Patient states she has been having regular mild , every 2-4 minutes contractions, none vaginal bleeding, intact membranes, with active fetal movement.  No blurry vision, headaches or peripheral edema, and RUQ pain.   Prenatal Course [place] By L[place] --->  Estimated Date of Delivery: 69/1/47Pregnancy complications or risks: Patient Active Problem List   Diagnosis Date Noted  . Gestational diabetes 12/02/2017  . Gestational diabetes mellitus (GDM) affecting pregnancy, antepartum 09/14/2017  . Elevated hemoglobin A1c 05/31/2017  . Supervision of high-risk pregnancy 05/28/2017  . Chronic hypertension complicating pregnancy, antepartum 05/28/2017   She plans to breastfeed She desires IUD for postpartum contraception.   Sono:    '@[redacted]w[redacted]d'$ , CWD, normal anatomy, cephalic presentation, anterior placenta, 4144g, >90% EFW, BPP 8/8, normal AFI  Prenatal labs and studies: ABO, Rh:  O POSITIVE Antibody:  NEGATIVE Rubella: Immune (11/08 0000) RPR: Non Reactive (03/14 1104)  HBsAg: Negative (11/08 0000)  HIV: Non Reactive (03/14 1104)  GWGN:FAOZHYQM(05/15 1034) 2 hr Glucola  ABNORMAL  Genetic screening DECLINED Anatomy UK[place]ormal  Prenatal Transfer Tool  Maternal Diabetes: Yes:  Diabetes Type:  Insulin/Medication controlled Genetic Screening: Declined Maternal Ultrasounds/Referrals: Normal Fetal Ultrasounds or other Referrals:  Referred to Materal Fetal Medicine  Maternal Substance Abuse:  No Significant Maternal Medications:  Meds include: Other: METFORMIN Significant Maternal Lab Results: None  Past Medical History:  Diagnosis Date  . Abscess   . Anemia   . Gestational diabetes   . Hypertension     Past Surgical History:  Procedure Laterality Date  .  CHOLECYSTECTOMY      OB History  Gravida Para Term Preterm AB Living  '3 1 1   1 1  '$ SAB TAB Ectopic Multiple Live Births  1       1    # Outcome Date GA Lbr Len/2nd Weight Sex Delivery Anes PTL Lv  3 Current           2 SAB 12/01/16          1 Term 03/27/10 384w5d4.536 kg (10 lb) M Vag-Spont   LIV    Social History   Socioeconomic History  . Marital status: Single    Spouse name: Not on file  . Number of children: Not on file  . Years of education: Not on file  . Highest education level: Not on file  Occupational History  . Not on file  Social Needs  . Financial resource strain: Not on file  . Food insecurity:    Worry: Not on file    Inability: Not on file  . Transportation needs:    Medical: Not on file    Non-medical: Not on file  Tobacco Use  . Smoking status: Former Smoker    Last attempt to quit: 05/28/2013    Years since quitting: 4.5  . Smokeless tobacco: Never Used  Substance and Sexual Activity  . Alcohol use: No  . Drug use: No  . Sexual activity: Yes  Lifestyle  . Physical activity:    Days per week: Not on file    Minutes per session: Not on file  . Stress: Not on file  Relationships  . Social connections:    Talks on phone: Not on file    Gets together: Not on  file    Attends religious service: Not on file    Active member of club or organization: Not on file    Attends meetings of clubs or organizations: Not on file    Relationship status: Not on file  Other Topics Concern  . Not on file  Social History Narrative  . Not on file    Family History  Problem Relation Age of Onset  . Hypertension Mother   . Asthma Mother   . Hypertension Father   . Diabetes Father   . Anemia Sister   . Heart disease Maternal Uncle   . Cancer Maternal Grandmother 32       Throat  . Diabetes Paternal Grandmother   . Kidney disease Paternal Grandmother   . Stroke Paternal Grandfather     Medications Prior to Admission  Medication Sig Dispense Refill  Last Dose  . acetaminophen (TYLENOL) 325 MG tablet Take 325 mg by mouth every 6 (six) hours as needed for mild pain.   Taking  . aspirin 81 MG chewable tablet Chew 1 tablet (81 mg total) by mouth daily. 30 tablet 12 Taking  . Blood Glucose Monitoring Suppl (ACCU-CHEK GUIDE) w/Device KIT 1 kit by Does not apply route as directed. 1 kit 0 Taking  . Elastic Bandages & Supports (COMFORT FIT MATERNITY SUPP MED) MISC Wear daily when ambulating 1 each 0 Taking  . glucose blood (ACCU-CHEK GUIDE) test strip Use as instructed 100 each 12 Taking  . Lancets (ACCU-CHEK MULTICLIX) lancets Use as instructed 100 each 12 Taking  . metFORMIN (GLUCOPHAGE) 500 MG tablet Take 2 tablets (1,000 mg total) by mouth daily with supper. 60 tablet 2 Taking  . Prenat-FeAsp-Meth-FA-DHA w/o A (PRENATE PIXIE) 10-0.6-0.4-200 MG CAPS Take 1 tablet by mouth daily. 30 capsule 12 Taking    Allergies  Allergen Reactions  . Doxycycline Nausea And Vomiting    Review of Systems: Negative except for what is mentioned in HPI.  Physical Exam: BP (!) 153/89 (BP Location: Right Arm)   Pulse 80   Temp 98.8 F (37.1 C) (Oral)   Resp 18   Ht '5\' 8"'$  (1.727 m)   Wt 134.3 kg (296 lb 1.6 oz)   LMP 03/02/2017 (Exact Date)   BMI 45.02 kg/m  CONSTITUTIONAL: Well-developed, well-nourished female in no acute distress.  HENT:  Normocephalic, atraumatic, External right and left ear normal. Oropharynx is clear and moist EYES: Conjunctivae and EOM are normal. Pupils are equal, round, and reactive to light. No scleral icterus.  NECK: Normal range of motion, supple, no masses SKIN: Skin is warm and dry. No rash noted. Not diaphoretic. No erythema. No pallor. NEUROLOGIC: Alert and oriented to person, place, and time. Normal reflexes, muscle tone coordination. No cranial nerve deficit noted. PSYCHIATRIC: Normal mood and affect. Normal behavior. Normal judgment and thought content. CARDIOVASCULAR: Normal heart rate noted, regular  rhythm RESPIRATORY: Effort and breath sounds normal, no problems with respiration noted ABDOMEN: Soft, nontender, nondistended, gravid. MUSCULOSKELETAL: Normal range of motion. No edema and no tenderness. 2+ distal pulses.  Cervical Exam: Dilation: 4.5 Effacement (%): 60 Cervical Position: Posterior Station: -2 Presentation: Vertex Exam by:: MD Gerarda Fraction  FHT:  Baseline rate 135 bpm   Variability moderate  Accelerations present   Decelerations none Contractions: Every 1-5 mins   Pertinent Labs/Studies:   Results for orders placed or performed during the hospital encounter of 12/02/17 (from the past 24 hour(s))  CBC     Status: Abnormal   Collection Time: 12/02/17  8:22  AM  Result Value Ref Range   WBC 10.7 (H) 4.0 - 10.5 K/uL   RBC 4.06 3.87 - 5.11 MIL/uL   Hemoglobin 8.1 (L) 12.0 - 15.0 g/dL   HCT 28.2 (L) 36.0 - 46.0 %   MCV 69.5 (L) 78.0 - 100.0 fL   MCH 20.0 (L) 26.0 - 34.0 pg   MCHC 28.7 (L) 30.0 - 36.0 g/dL   RDW 19.2 (H) 11.5 - 15.5 %   Platelets 229 150 - 400 K/uL  Comprehensive metabolic panel     Status: Abnormal   Collection Time: 12/02/17  8:22 AM  Result Value Ref Range   Sodium 135 135 - 145 mmol/L   Potassium 3.7 3.5 - 5.1 mmol/L   Chloride 104 101 - 111 mmol/L   CO2 21 (L) 22 - 32 mmol/L   Glucose, Bld 89 65 - 99 mg/dL   BUN 6 6 - 20 mg/dL   Creatinine, Ser 0.52 0.44 - 1.00 mg/dL   Calcium 8.4 (L) 8.9 - 10.3 mg/dL   Total Protein 6.6 6.5 - 8.1 g/dL   Albumin 2.7 (L) 3.5 - 5.0 g/dL   AST 21 15 - 41 U/L   ALT 20 14 - 54 U/L   Alkaline Phosphatase 201 (H) 38 - 126 U/L   Total Bilirubin 0.1 (L) 0.3 - 1.2 mg/dL   GFR calc non Af Amer >60 >60 mL/min   GFR calc Af Amer >60 >60 mL/min   Anion gap 10 5 - 15    Assessment : Gabriella [name] is a 32 y.o. G3P1011 at 86w2dbeing admitted for induction of labor due to A2GDM. Also has cHTN.  Plan: Labor: Start induction with Pitocin. Continue to increase then plan on AROM.   GDM: monitor CBGs q4hrs while in  latent labor; q2hr in active labor. CHTN: Elevated BPs here. PIH labs normal. Asymptomatic. Monitor.  Analgesia as needed. Planning on epidural FWB: Reassuring fetal heart tracing.   GBS negative Delivery plan: Hopeful for vaginal delivery   JLuiz Blare DO OB Fellow Faculty Practice, [facility]6/08/2017, 8:59 AM

## 2017-12-02 NOTE — Anesthesia Procedure Notes (Signed)
Epidural Patient location during procedure: OB Start time: 12/02/2017 10:15 AM End time: 12/02/2017 10:25 AM  Staffing Anesthesiologist: Lewie LoronGermeroth, England Greb, MD Performed: anesthesiologist   Preanesthetic Checklist Completed: patient identified, pre-op evaluation, timeout performed, IV checked, risks and benefits discussed and monitors and equipment checked  Epidural Patient position: sitting Prep: site prepped and draped and DuraPrep Patient monitoring: heart rate, continuous pulse ox and blood pressure Approach: midline Injection technique: LOR air and LOR saline  Needle:  Needle type: Tuohy  Needle gauge: 17 G Needle length: 9 cm Needle insertion depth: 8 cm Catheter type: closed end flexible Catheter size: 19 Gauge Catheter at skin depth: 13 cm Test dose: negative  Assessment Sensory level: T8 Events: blood not aspirated, injection not painful, no injection resistance, negative IV test and no paresthesia  Additional Notes Reason for block:procedure for pain

## 2017-12-03 ENCOUNTER — Encounter (HOSPITAL_COMMUNITY): Payer: Self-pay

## 2017-12-03 NOTE — Lactation Note (Signed)
This note was copied from a baby's chart. Lactation Consultation Note  Patient Name: Gabriella Duanne LimerickJessica Costilla WUJWJ'XToday's Date: 12/03/2017 Reason for consult: Follow-up assessment Mom states feedings are going well.  She had RN check recent latch and it was a good latch.  Instructed to watch for feeding cues and cluster feeding discussed.  Encouraged to call out for assist prn.  Maternal Data    Feeding    LATCH Score                   Interventions    Lactation Tools Discussed/Used     Consult Status Consult Status: Follow-up Date: 12/04/17 Follow-up type: In-patient    Huston FoleyMOULDEN, Manette Doto S 12/03/2017, 10:19 AM

## 2017-12-03 NOTE — Anesthesia Postprocedure Evaluation (Signed)
Anesthesia Post Note  Patient: Gabriella [name]  Procedure(s) Performed: AN AD HOC LABOR EPIDURAL     Patient location during evaluation: Mother Baby Anesthesia Type: Epidural Level of consciousness: awake and alert Pain management: pain level controlled Vital Signs Assessment: post-procedure vital signs reviewed and stable Respiratory status: spontaneous breathing, nonlabored ventilation and respiratory function stable Cardiovascular status: stable Postop Assessment: no headache, no backache and epidural receding Anesthetic complications: no    Last Vitals:  Vitals:   12/02/17 1846 12/03/17 0611  BP: 139/70 130/73  Pulse: 74 68  Resp:  18  Temp: 37.1 C 36.6 C  SpO2:  100%    Last Pain:  Vitals:   12/03/17 0612  TempSrc:   PainSc: 0-No pain   Pain Goal: Patients Stated Pain Goal: 2 (12/02/17 2353)               Marrion CoyMERRITT,Canaan Holzer

## 2017-12-03 NOTE — Plan of Care (Signed)
Mom is progressing well.  This evening we have worked on breastfeeding and will continue through the night.  Mom reports no pain

## 2017-12-04 LAB — BIRTH TISSUE RECOVERY COLLECTION (PLACENTA DONATION)

## 2017-12-04 NOTE — Lactation Note (Signed)
This note was copied from a baby's chart. Lactation Consultation Note  Patient Name: Gabriella [name] ZOXWR'UToday's Date: 12/04/2017 Reason for consult: Follow-up assessment;Term;Infant weight loss  Baby is 5943 hours old  LC reviewed and updated the doc flow sheets.  LC and orientee assisted to latch in the football on the right breast,  At 1st, on an off latch. Baby finally opened mouth well, and latched with depth.  Swallows noted and increased with breast compressions.  Per mom has DEBP Medela  Mother informed of post-discharge support and given phone number to the lactation department, including services for phone call assistance; out-patient appointments; and breastfeeding support group. List of other breastfeeding resources in the community given in the handout. Encouraged mother to call for problems or concerns related to breastfeeding.   Maternal Data Has patient been taught Hand Expression?: Yes  Feeding Feeding Type: Breast Fed Length of feed: 12 min(swallows noted/ increased with breast compressions )  LATCH Score Latch: Repeated attempts needed to sustain latch, nipple held in mouth throughout feeding, stimulation needed to elicit sucking reflex.  Audible Swallowing: Spontaneous and intermittent  Type of Nipple: Everted at rest and after stimulation  Comfort (Breast/Nipple): Soft / non-tender  Hold (Positioning): Assistance needed to correctly position infant at breast and maintain latch.  LATCH Score: 8  Interventions Interventions: Breast feeding basics reviewed;Assisted with latch;Skin to skin;Breast massage;Breast compression;Adjust position;Support pillows;Position options  Lactation Tools Discussed/Used Tools: Pump;Shells Nipple shield size: (no NS was used on the right breast ) Shell Type: Inverted Breast pump type: Double-Electric Breast Pump WIC Program: Yes Pump Review: Milk Storage   Consult Status Consult Status: Follow-up Date: (LC recommended for  [facility] if still using the NS ) Follow-up type: Out-patient    Matilde SprangMargaret Ann [name] 12/04/2017, 12:30 PM

## 2017-12-04 NOTE — Discharge Instructions (Signed)
Postpartum Care After Vaginal Delivery °The period of time right after you deliver your newborn is called the postpartum period. °What kind of medical care will I receive? °· You may continue to receive fluids and medicines through an IV tube inserted into one of your veins. °· If an incision was made near your vagina (episiotomy) or if you had some vaginal tearing during delivery, cold compresses may be placed on your episiotomy or your tear. This helps to reduce pain and swelling. °· You may be given a squirt bottle to use when you go to the bathroom. You may use this until you are comfortable wiping as usual. To use the squirt bottle, follow these steps: °? Before you urinate, fill the squirt bottle with warm water. Do not use hot water. °? After you urinate, while you are sitting on the toilet, use the squirt bottle to rinse the area around your urethra and vaginal opening. This rinses away any urine and blood. °? You may do this instead of wiping. As you start healing, you may use the squirt bottle before wiping yourself. Make sure to wipe gently. °? Fill the squirt bottle with clean water every time you use the bathroom. °· You will be given sanitary pads to wear. °How can I expect to feel? °· You may not feel the need to urinate for several hours after delivery. °· You will have some soreness and pain in your abdomen and vagina. °· If you are breastfeeding, you may have uterine contractions every time you breastfeed for up to several weeks postpartum. Uterine contractions help your uterus return to its normal size. °· It is normal to have vaginal bleeding (lochia) after delivery. The amount and appearance of lochia is often similar to a menstrual period in the first week after delivery. It will gradually decrease over the next few weeks to a dry, yellow-brown discharge. For most women, lochia stops completely by 6-8 weeks after delivery. Vaginal bleeding can vary from woman to woman. °· Within the first few  days after delivery, you may have breast engorgement. This is when your breasts feel heavy, full, and uncomfortable. Your breasts may also throb and feel hard, tightly stretched, warm, and tender. After this occurs, you may have milk leaking from your breasts. Your health care provider can help you relieve discomfort due to breast engorgement. Breast engorgement should go away within a few days. °· You may feel more sad or worried than normal due to hormonal changes after delivery. These feelings should not last more than a few days. If these feelings do not go away after several days, speak with your health care provider. °How should I care for myself? °· Tell your health care provider if you have pain or discomfort. °· Drink enough water to keep your urine clear or pale yellow. °· Wash your hands thoroughly with soap and water for at least 20 seconds after changing your sanitary pads, after using the toilet, and before holding or feeding your baby. °· If you are not breastfeeding, avoid touching your breasts a lot. Doing this can make your breasts produce more milk. °· If you become weak or lightheaded, or you feel like you might faint, ask for help before: °? Getting out of bed. °? Showering. °· Change your sanitary pads frequently. Watch for any changes in your flow, such as a sudden increase in volume, a change in color, the passing of large blood clots. If you pass a blood clot from your vagina, save it   to show to your health care provider. Do not flush blood clots down the toilet without having your health care provider look at them. °· Make sure that all your vaccinations are up to date. This can help protect you and your baby from getting certain diseases. You may need to have immunizations done before you leave the hospital. °· If desired, talk with your health care provider about methods of family planning or birth control (contraception). °How can I start bonding with my baby? °Spending as much time as  possible with your baby is very important. During this time, you and your baby can get to know each other and develop a bond. Having your baby stay with you in your room (rooming in) can give you time to get to know your baby. Rooming in can also help you become comfortable caring for your baby. Breastfeeding can also help you bond with your baby. °How can I plan for returning home with my baby? °· Make sure that you have a car seat installed in your vehicle. °? Your car seat should be checked by a certified car seat installer to make sure that it is installed safely. °? Make sure that your baby fits into the car seat safely. °· Ask your health care provider any questions you have about caring for yourself or your baby. Make sure that you are able to contact your health care provider with any questions after leaving the hospital. °This information is not intended to replace advice given to you by your health care provider. Make sure you discuss any questions you have with your health care provider. °Document Released: 04/16/2007 Document Revised: 11/22/2015 Document Reviewed: 05/24/2015 °Elsevier Interactive Patient Education © 2018 [employer] ° °

## 2017-12-04 NOTE — Discharge Summary (Signed)
OB Discharge Summary     Patient Name: Gabriella [name] DOB: 04-Aug-1985 MRN: [id number]  Date of admission: 12/02/2017 Delivering MD: Katheren Shams   Date of discharge: 12/04/2017  Admitting diagnosis: 39wks induction  Intrauterine pregnancy: [redacted]w[redacted]d    Secondary diagnosis:  Active Problems:   Chronic hypertension complicating pregnancy, antepartum   Gestational diabetes   SVD (spontaneous vaginal delivery)      Discharge diagnosis: Term Pregnancy Delivered                                                                                                Post partum procedures:none  Augmentation: AROM and Pitocin  Complications: None  Hospital course:  Induction of Labor With Vaginal Delivery   32y.o. yo [id number] admitted to the hospital 12/02/2017 for induction of labor.  Indication for induction: A2 DM.  Patient had an uncomplicated labor course as follows: Membrane Rupture Time/Date: 11:35 AM ,12/02/2017   Intrapartum Procedures: Episiotomy: None [1]                                         Lacerations:  Periurethral [8]  Patient had delivery of a Viable infant.  Information for the patient's newborn:  BaZaryiah, Barz0[[id number]]Delivery Method: Vag-Spont   12/02/2017  Details of delivery can be found in separate delivery note.  Patient had a routine postpartum course. Patient is discharged home 12/04/17.  Physical exam  Vitals:   12/03/17 0611 12/03/17 1812 12/04/17 0607 12/04/17 1235  BP: 130/73 125/67 (!) 142/67 133/64  Pulse: 68 81 68 80  Resp: '18 20 18 18  '$ Temp: 97.9 F (36.6 C) 98.4 F (36.9 C) 98.4 F (36.9 C) 98.9 F (37.2 C)  TempSrc: Oral Oral    SpO2: 100%     Weight:      Height:       General: alert, cooperative and no distress Lochia: appropriate Uterine Fundus: firm Incision: N/A DVT Evaluation: No evidence of DVT seen on physical exam. Labs: Lab Results  Component Value Date   WBC 10.7 (H) 12/02/2017   HGB 8.1 (L) 12/02/2017    HCT 28.2 (L) 12/02/2017   MCV 69.5 (L) 12/02/2017   PLT 229 12/02/2017   CMP Latest Ref Rng & Units 12/02/2017  Glucose 65 - 99 mg/dL 89  BUN 6 - 20 mg/dL 6  Creatinine 0.44 - 1.00 mg/dL 0.52  Sodium 135 - 145 mmol/L 135  Potassium 3.5 - 5.1 mmol/L 3.7  Chloride 101 - 111 mmol/L 104  CO2 22 - 32 mmol/L 21(L)  Calcium 8.9 - 10.3 mg/dL 8.4(L)  Total Protein 6.5 - 8.1 g/dL 6.6  Total Bilirubin 0.3 - 1.2 mg/dL 0.1(L)  Alkaline Phos 38 - 126 U/L 201(H)  AST 15 - 41 U/L 21  ALT 14 - 54 U/L 20    Discharge instruction: per After Visit Summary and "Baby and Me Booklet".  After visit meds:  Allergies as of 12/04/2017  Reactions   Doxycycline Nausea And Vomiting      Medication List    STOP taking these medications   ACCU-CHEK GUIDE w/Device Kit   accu-chek multiclix lancets   glucose blood test strip Commonly known as:  ACCU-CHEK GUIDE   metFORMIN 500 MG tablet Commonly known as:  GLUCOPHAGE     TAKE these medications   acetaminophen 500 MG tablet Commonly known as:  TYLENOL Take 500 mg by mouth every 6 (six) hours as needed for mild pain.   aspirin 81 MG chewable tablet Chew 1 tablet (81 mg total) by mouth daily.   COMFORT FIT MATERNITY SUPP MED Misc Wear daily when ambulating   PRENATE PIXIE 10-0.6-0.4-200 MG Caps Take 1 tablet by mouth daily.       Diet: routine diet  Activity: Advance as tolerated. Pelvic rest for 6 weeks.   Outpatient follow up: BP check in 5 days. PPV with 2-hr GTT in 4 weeks Follow up Appt: Future Appointments  Date Time Provider [place]  01/14/2018  9:15 AM [facility] None  01/14/2018  9:30 AM Constant, Peggy, MD [facility] None   Follow up Visit:No follow-ups on file.  Postpartum contraception: IUD     Newborn Data: Live born female  Birth Weight: 8 lb 6 oz (3800 g) APGAR: 54, 9  Newborn Delivery   Birth date/time:  12/02/2017 17:01:00 Delivery type:  Vaginal, Spontaneous     Baby Feeding:  Breast Disposition:home with mother   12/04/2017 Gailen Shelter, MD

## 2018-01-14 ENCOUNTER — Other Ambulatory Visit: Payer: Medicaid Other

## 2018-01-14 ENCOUNTER — Encounter: Payer: Self-pay | Admitting: Obstetrics and Gynecology

## 2018-01-14 ENCOUNTER — Ambulatory Visit (INDEPENDENT_AMBULATORY_CARE_PROVIDER_SITE_OTHER): Payer: Medicaid Other | Admitting: Obstetrics and Gynecology

## 2018-01-14 DIAGNOSIS — Z3202 Encounter for pregnancy test, result negative: Secondary | ICD-10-CM

## 2018-01-14 DIAGNOSIS — Z3046 Encounter for surveillance of implantable subdermal contraceptive: Secondary | ICD-10-CM | POA: Diagnosis not present

## 2018-01-14 DIAGNOSIS — Z1389 Encounter for screening for other disorder: Secondary | ICD-10-CM

## 2018-01-14 DIAGNOSIS — Z30017 Encounter for initial prescription of implantable subdermal contraceptive: Secondary | ICD-10-CM

## 2018-01-14 DIAGNOSIS — O24419 Gestational diabetes mellitus in pregnancy, unspecified control: Secondary | ICD-10-CM

## 2018-01-14 LAB — POCT URINE PREGNANCY: PREG TEST UR: NEGATIVE

## 2018-01-14 MED ORDER — ETONOGESTREL 68 MG ~~LOC~~ IMPL
68.0000 mg | DRUG_IMPLANT | Freq: Once | SUBCUTANEOUS | Status: AC
  Administered 2018-01-14: 68 mg via SUBCUTANEOUS

## 2018-01-14 NOTE — Progress Notes (Signed)
Post Partum Exam  Gabriella FishJessica N Pickel is a 32 y.o. 773P2012 female who presents for a postpartum visit. She is 6 weeks postpartum following a spontaneous vaginal delivery. I have fully reviewed the prenatal and intrapartum course. The delivery was at 39 gestational weeks.  Anesthesia: epidural. Postpartum course has been uncomplicated. Baby's course has been uncomplicated. Baby is feeding by both breast and bottle - Lucien MonsGerber Good Start Gentle. Bleeding no bleeding. Bowel function is normal. Bladder function is normal. Patient is not sexually active. Contraception method is none. Postpartum depression screening:neg    Last pap smear done 05/2017 and was Normal  Review of Systems Pertinent items are noted in HPI.    Objective:  Blood pressure 139/83, pulse 69, height 5\' 9"  (1.753 m), weight 269 lb (122 kg), last menstrual period 01/03/2018, currently breastfeeding.  General:  alert, cooperative and no distress   Breasts:  inspection negative, no nipple discharge or bleeding, no masses or nodularity palpable  Lungs: clear to auscultation bilaterally  Heart:  regular rate and rhythm  Abdomen: soft, non-tender; bowel sounds normal; no masses,  no organomegaly   Vulva:  normal  Vagina: normal vagina, no discharge, exudate, lesion, or erythema  Cervix:  multiparous appearance  Corpus: normal size, contour, position, consistency, mobility, non-tender  Adnexa:  normal adnexa and no mass, fullness, tenderness  Rectal Exam: Not performed.        Assessment:    Normal postpartum exam. Pap smear not done at today's visit.   Plan:   1. Contraception: Nexplanon  Patient given informed consent, signed copy in the chart, time out was performed. Pregnancy test was negative. Appropriate time out taken.  Patient's left arm was prepped and draped in the usual sterile fashion.. The ruler used to measure and mark insertion area.  Patient was prepped with alcohol swab and then injected with 2 cc of 1% lidocaine  with epinephrine.  Patient was prepped with betadine, Nexplanon removed form packaging.  Device confirmed in needle, then inserted full length of needle and withdrawn per handbook instructions.  Patient insertion site covered with a band aid and pressure dressing.   Minimal blood loss.  Patient tolerated the procedure well.  2. Patient is medically cleared to resume all activities of daily living. Patient with GDM during pregnancy- 2 hour glucola today 3. Follow up in: 4 months for annual exam or as needed.

## 2018-01-14 NOTE — Addendum Note (Signed)
Addended by: Marya LandryFOSTER, SUZANNE D on: 01/14/2018 10:15 AM   Modules accepted: Orders

## 2018-01-15 LAB — GLUCOSE TOLERANCE, 2 HOURS
Glucose, 2 hour: 60 mg/dL — ABNORMAL LOW (ref 65–139)
Glucose, GTT - Fasting: 87 mg/dL (ref 65–99)

## 2018-08-01 ENCOUNTER — Telehealth: Payer: Self-pay

## 2018-08-01 NOTE — Telephone Encounter (Signed)
Returned call, advised that irregular bleeding can occur with nexplanon, advised to call back if symptoms get worse, pt agreed. Pt wants to schedule annual, advised scheduler will call.

## 2018-08-28 ENCOUNTER — Ambulatory Visit: Payer: Medicaid Other | Admitting: Obstetrics and Gynecology

## 2019-05-24 IMAGING — US US MFM OB DETAIL+14 WK
1 series · 14 of 28 positions shown · non-contrast
Comparison: none

[Series 1: us mfm ob detail+14 wk · 14 of 92 slices shown]
[im 4/92]
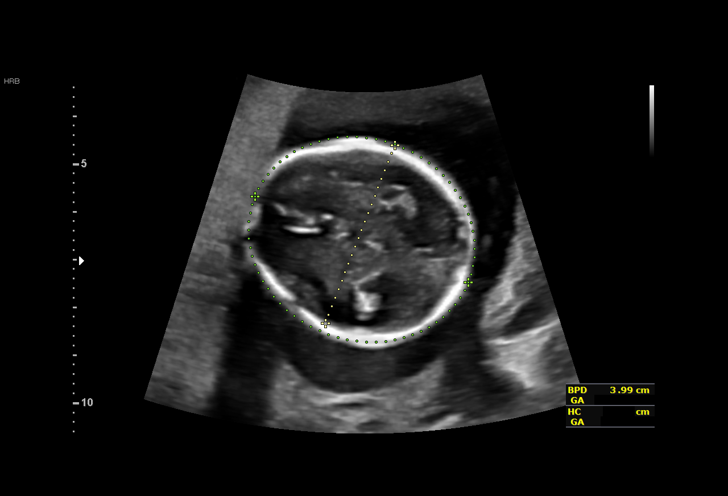
[im 11/92]
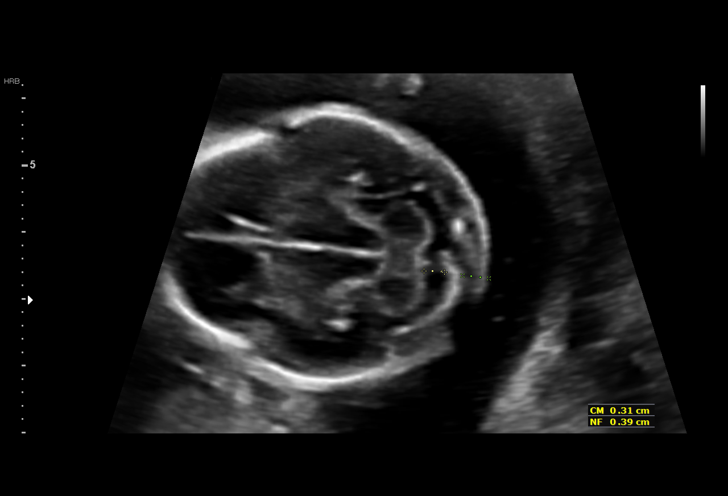
[im 17/92]
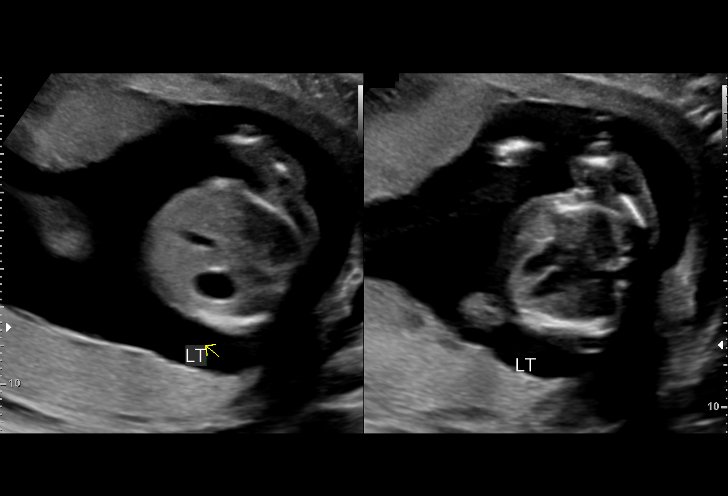
[im 24/92]
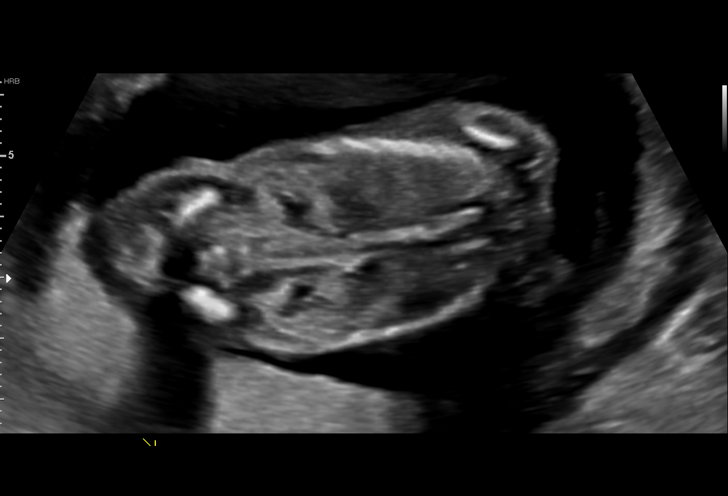
[im 31/92]
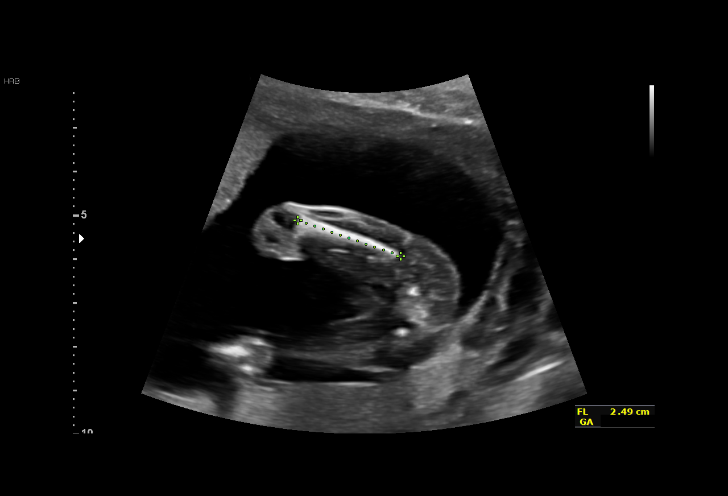
[im 38/92]
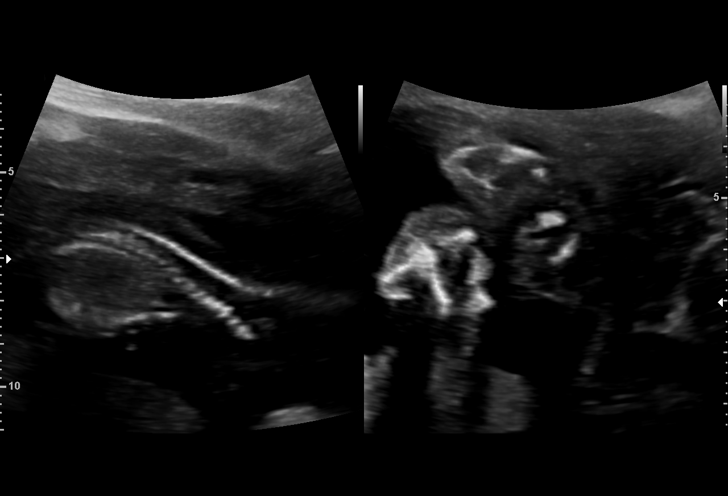
[im 44/92]
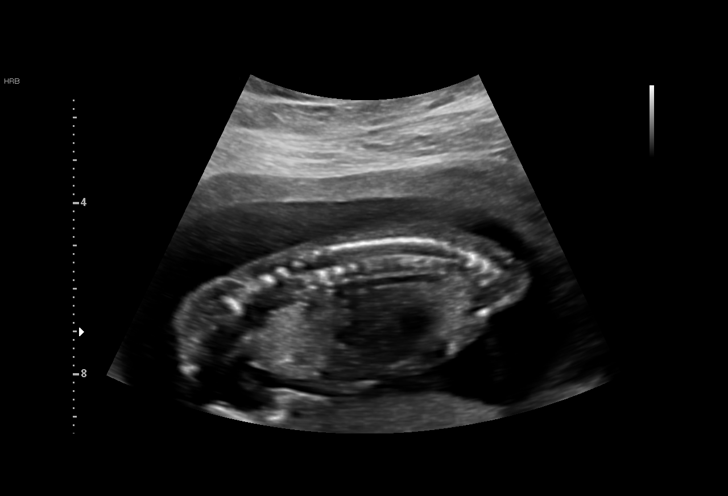
[im 51/92]
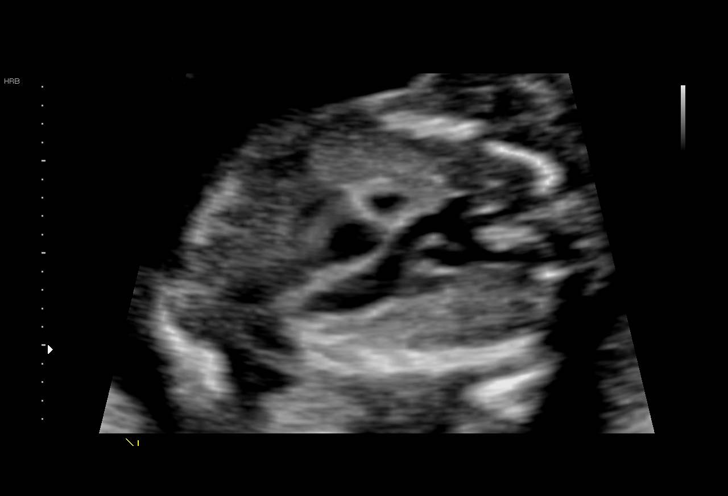
[im 58/92]
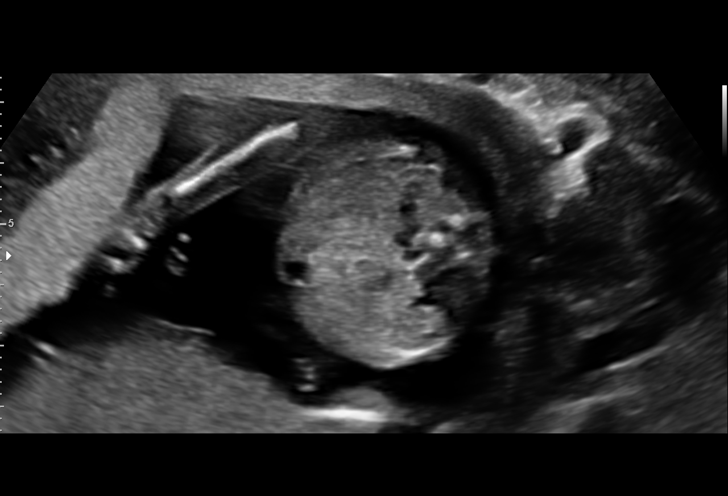
[im 65/92]
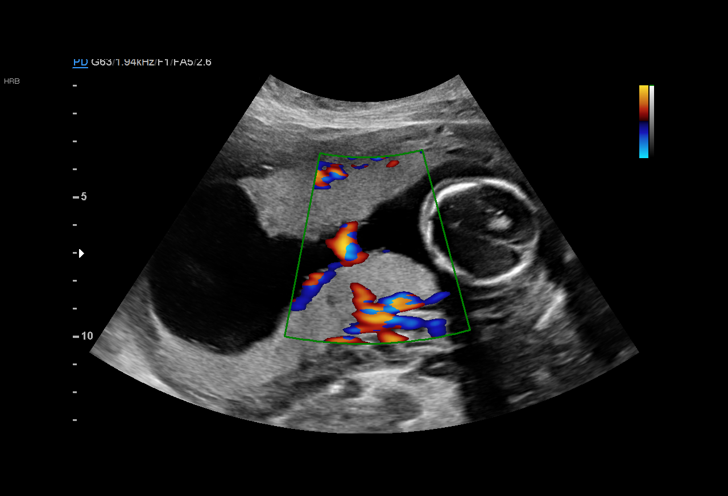
[im 71/92]
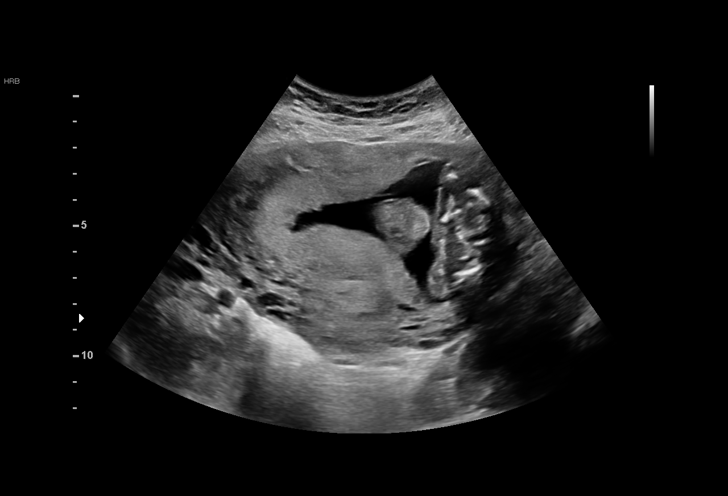
[im 78/92]
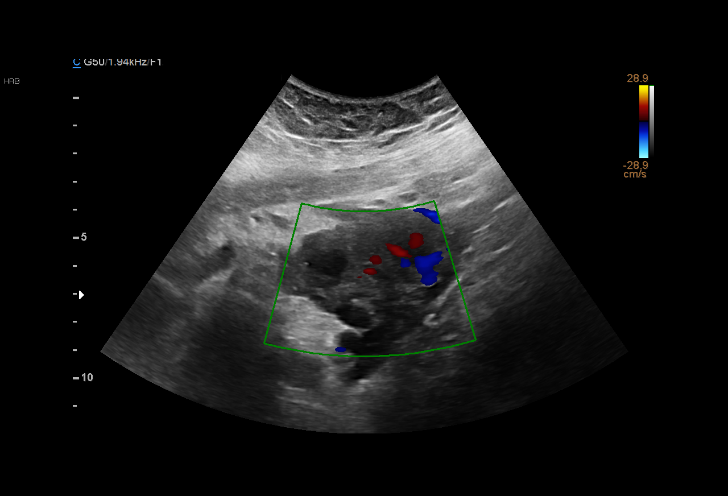
[im 85/92]
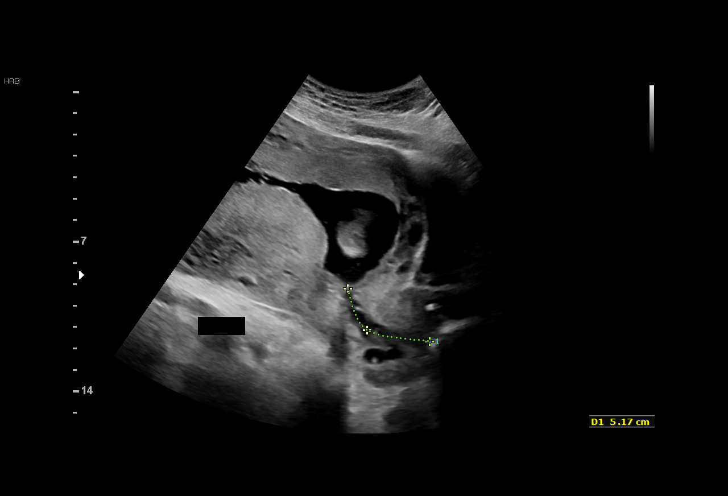
[im 92/92]
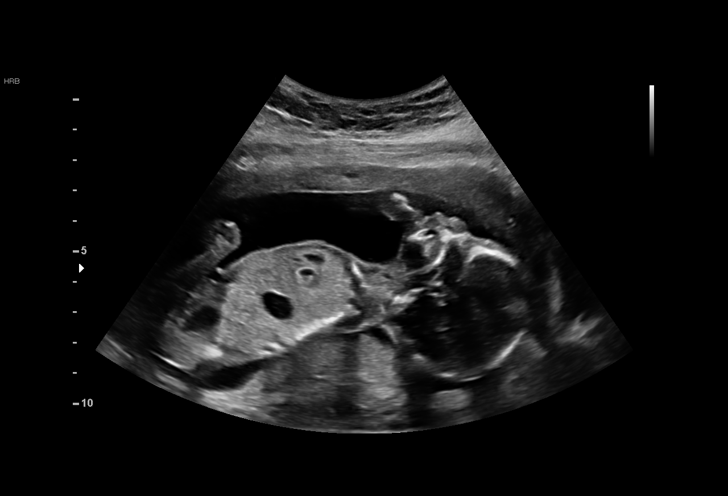

[14 of 28 positions shown; findings below may reference images not displayed]

Road [HOSPITAL]

Indications

17 weeks gestation of pregnancy
Hypertension - Chronic/Pre-existing; ASA
Encounter for fetal anatomic survey
Maternal morbid obesity
Poor obstetric history: Prior fetal
macrosomia, antepartum; 9+1
OB History

Blood Type:            Height:  5'6"   Weight (lb):  290       BMI:
Gravidity:    3         Term:   1         SAB:   1
Living:       1
Fetal Evaluation

Num Of Fetuses:     1
Fetal Heart         162
Rate(bpm):
Cardiac Activity:   Observed
Presentation:       Cephalic
Placenta:           Right lateral, above cervical os
P. Cord Insertion:  Visualized, central

Amniotic Fluid
AFI FV:      Subjectively within normal limits

Largest Pocket(cm)
3.78
Biometry
BPD:      39.8  mm     G. Age:  18w 0d         68  %    CI:         80.2   %    70 - 86
FL/HC:      17.6   %    15.8 - 18
HC:      140.4  mm     G. Age:  17w 2d         25  %    HC/AC:      1.10        1.07 -
AC:      127.8  mm     G. Age:  18w 3d         69  %    FL/BPD:     62.1   %
FL:       24.7  mm     G. Age:  17w 3d         36  %    FL/AC:      19.3   %    20 - 24
CER:      17.5  mm     G. Age:  17w 4d         45  %
NFT:       3.9  mm
CM:        3.1  mm

Est. FW:     214  gm      0 lb 8 oz     53  %
Gestational Age

LMP:           17w 5d        Date:  03/02/17                 EDD:   12/07/17
U/S Today:     17w 6d                                        EDD:   12/06/17
Best:          17w 5d     Det. By:  LMP  (03/02/17)          EDD:   12/07/17
Anatomy

Cranium:               Appears normal         Aortic Arch:            Appears normal
Cavum:                 Appears normal         Ductal Arch:            Appears normal
Ventricles:            Appears normal         Diaphragm:              Appears normal
Choroid Plexus:        Appears normal         Stomach:                Appears normal, left
sided
Cerebellum:            Appears normal         Abdomen:                Appears normal
Posterior Fossa:       Appears normal         Abdominal Wall:         Appears nml (cord
insert, abd wall)
Nuchal Fold:           Appears normal         Cord Vessels:           Appears normal (3
vessel cord)
Face:                  Orbits nl; profile not Kidneys:                Appear normal
well visualized
Lips:                  Appears normal         Bladder:                Appears normal
Thoracic:              Appears normal         Spine:                  Limited views
appear normal
(distal)
Heart:                 Appears normal         Upper Extremities:      Appears normal
(4CH, axis, and situs
RVOT:                  Appears normal         Lower Extremities:      Appears normal
LVOT:                  Appears normal

Other:  Male gender. Open hands visualized. Nasal bone visualized.
Cervix Uterus Adnexa

Cervix
Length:            5.2  cm.
Normal appearance by transabdominal scan.

Uterus
No abnormality visualized.

Left Ovary
Within normal limits.

Right Ovary
Size(cm)     3.61   x   2.01   x  2.62      Vol(ml): 10
Within normal limits.

Cul De Sac:   No free fluid seen.
Impression

SIUP at 17+5 weeks
Normal detailed fetal anatomy; limited views of profile and
distal spine
Markers of aneuploidy: none
Normal amniotic fluid volume
Measurements consistent with LMP dating
Recommendations

Follow-up ultrasound in 6 weeks to complete anatomy survey
and assess growth

## 2019-09-27 IMAGING — US US MFM FETAL BPP W/O NON-STRESS
1 series · 12 of 28 positions shown · non-contrast
Comparison: none

[Series 1: us mfm fetal bpp w/o non-stress · 49 acquisitions, 12 frames shown]
[im 2/49]
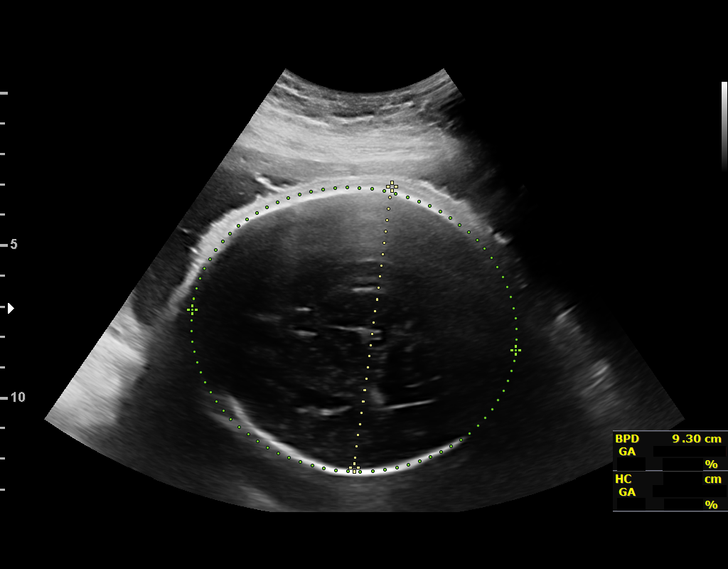
[im 6/49]
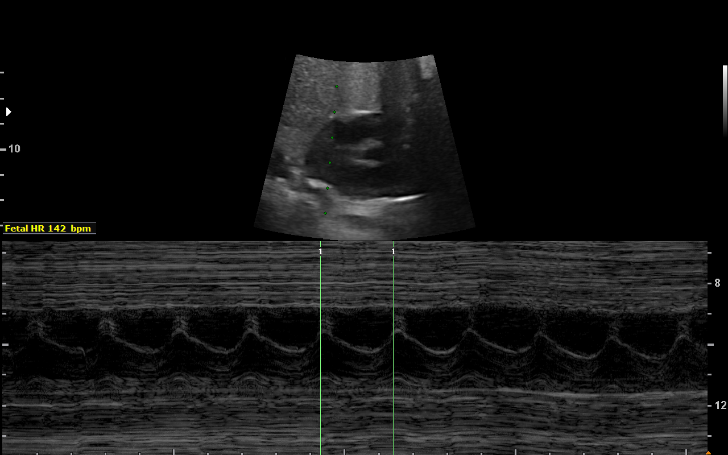
[im 9/49]
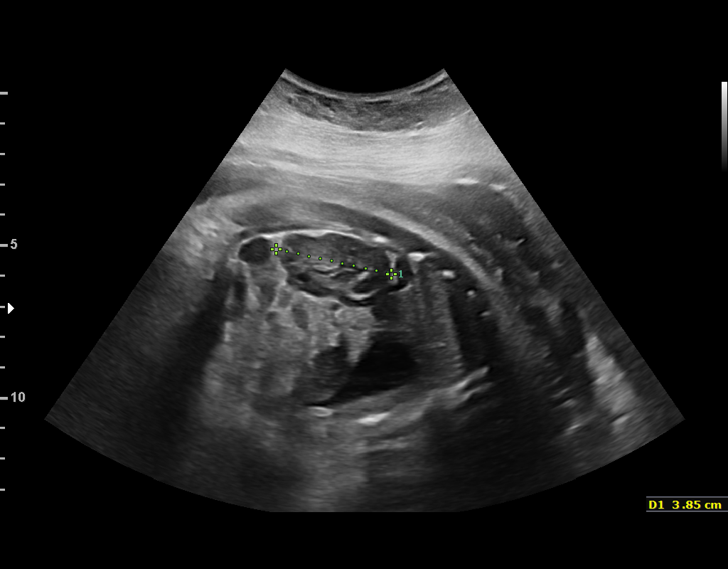
[im 15/49]
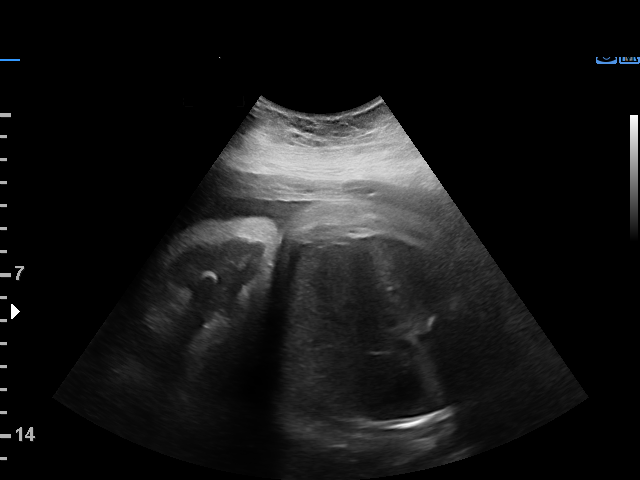
[im 18/49]
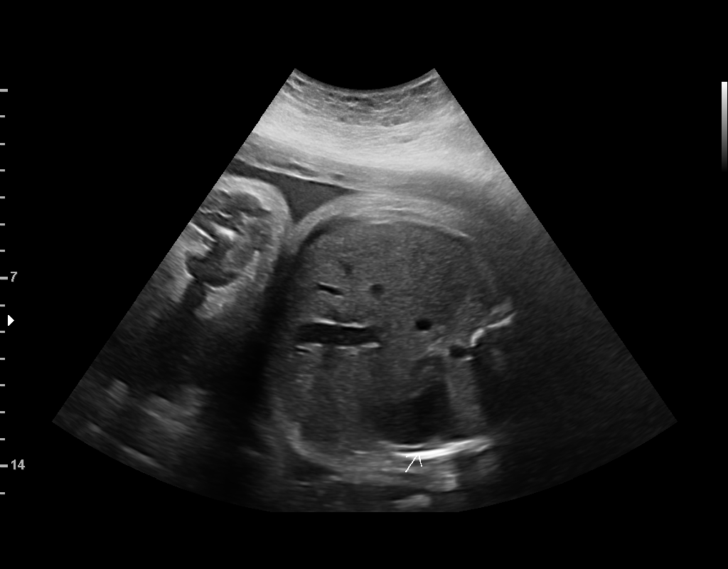
[im 22/49]
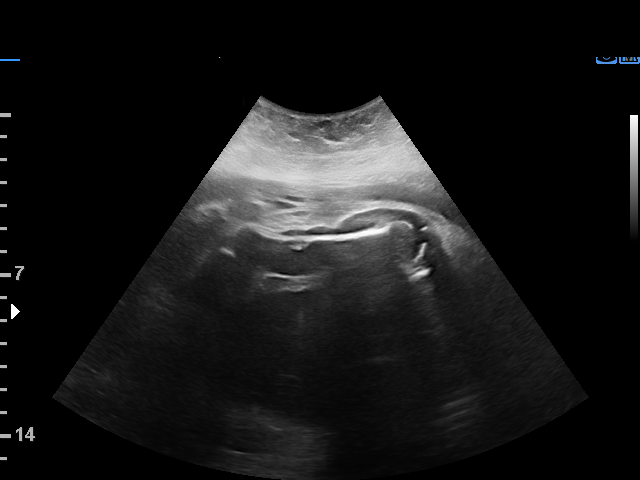
[im 27/49]
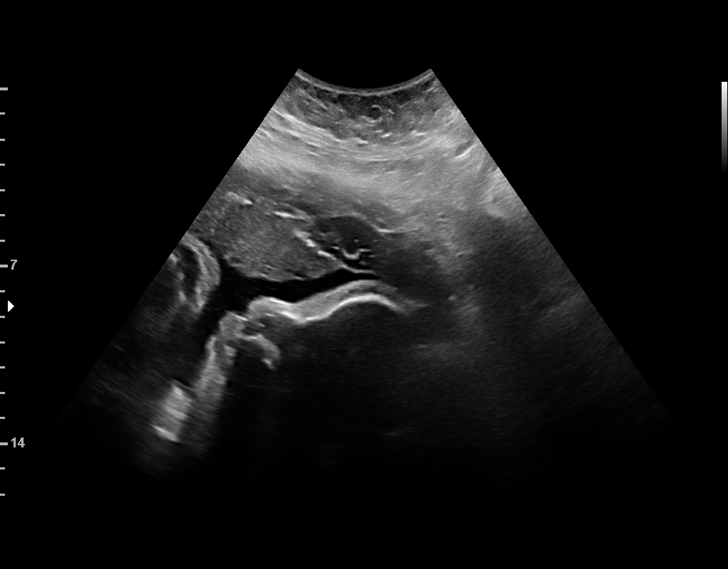
[im 31/49]
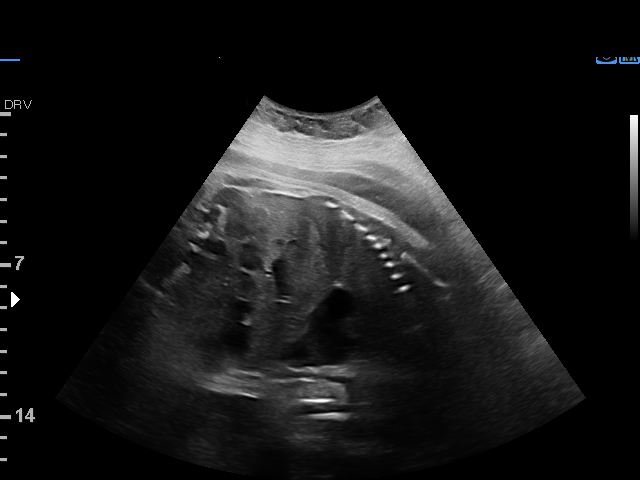
[im 34/49]
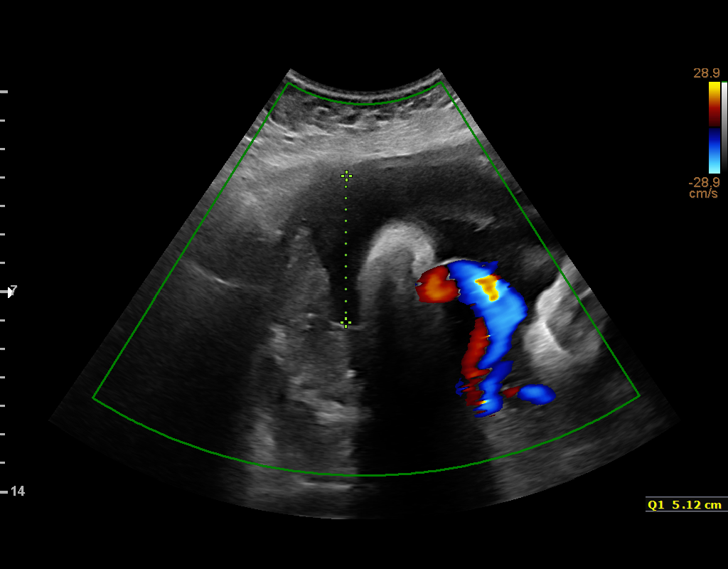
[im 40/49]
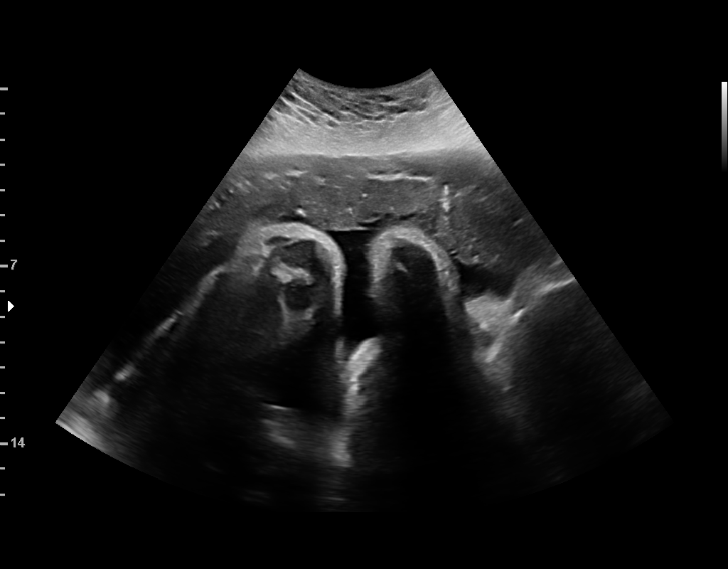
[im 43/49]
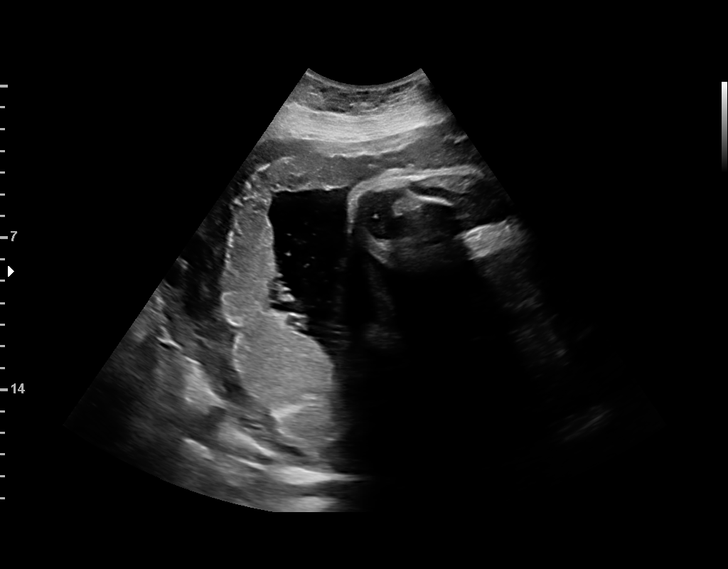
[im 47/49]
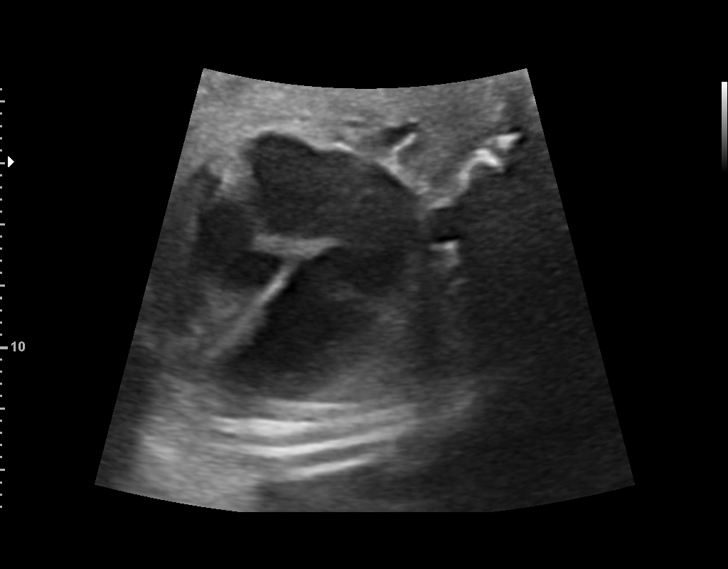

[12 of 28 positions shown; findings below may reference images not displayed]

Road [HOSPITAL]

2  STEPHANI SOU[facility]           [id number]      [id number]     [id number]
Indications

35 weeks gestation of pregnancy
Hypertension - Chronic/Pre-existing
Gestational diabetes in pregnancy,
controlled by oral hypoglycemic drugs
(metformin)
Maternal morbid obesity
Encounter for other antenatal screening
follow-up
OB History

Blood Type:            Height:  5'6"   Weight (lb):  290       BMI:
Gravidity:    3         Term:   1         SAB:   1
Living:       1
Fetal Evaluation

Num Of Fetuses:     1
Fetal Heart         142
Rate(bpm):
Cardiac Activity:   Observed
Presentation:       Cephalic
Placenta:           Right lateral, ant above cervical os
P. Cord Insertion:  Previously Visualized

Amniotic Fluid
AFI FV:      Subjectively within normal limits

AFI Sum(cm)     %Tile       Largest Pocket(cm)
16.91           62
RUQ(cm)       RLQ(cm)       LUQ(cm)        LLQ(cm)
5.12
Biophysical Evaluation

Amniotic F.V:   Within normal limits       F. Tone:        Observed
F. Movement:    Observed                   Score:          [DATE]
F. Breathing:   Observed
Biometry

BPD:      92.9  mm     G. Age:  37w 5d         95  %    CI:         83.9   %    70 - 86
FL/HC:      21.1   %    20.1 -
HC:      319.7  mm     G. Age:  36w 0d         26  %    HC/AC:      0.94        0.93 -
AC:      341.1  mm     G. Age:  38w 0d       > 97  %    FL/BPD:     72.7   %    71 - 87
FL:       67.5  mm     G. Age:  34w 5d         21  %    FL/AC:      19.8   %    20 - 24
HUM:      62.5  mm     G. Age:  36w 2d         73  %

Est. FW:    5872  gm    6 lb 13 oz      84  %
Gestational Age

LMP:           35w 5d        Date:  03/02/17                 EDD:   12/07/17
U/S Today:     36w 4d                                        EDD:   12/01/17
Best:          35w 5d     Det. By:  LMP  (03/02/17)          EDD:   12/07/17
Anatomy

Cranium:               Appears normal         Aortic Arch:            Previously seen
Cavum:                 Previously seen        Ductal Arch:            Previously seen
Ventricles:            Appears normal         Diaphragm:              Appears normal
Choroid Plexus:        Previously seen        Stomach:                Appears normal, left
sided
Cerebellum:            Previously seen        Abdomen:                Appears normal
Posterior Fossa:       Previously seen        Abdominal Wall:         Previously seen
Nuchal Fold:           Previously seen        Cord Vessels:           Previously seen
Face:                  Orbits and profile     Kidneys:                Appear normal
previously seen
Lips:                  Previously seen        Bladder:                Appears normal
Thoracic:              Previously seen        Spine:                  Previously seen
Heart:                 Appears normal         Upper Extremities:      Previously seen
(4CH, axis, and situs
RVOT:                  Previously seen        Lower Extremities:      Previously seen
LVOT:                  Appears normal

Other:  Male gender prev seen. Open hands previously visualized. Nasal
bone prev visualized.
Cervix Uterus Adnexa

Cervix
Not visualized (advanced GA >12wks)
Impression

Singleton intrauterine pregnancy at 35+5 weeks with CHTN
and GDM here for growth evaluation and BPP
Interval review of the anatomy shows no sonographic
markers for aneuploidy or structural anomalies
All relevant fetal anatomy has been visualized
Amniotic fluid volume is normal
Estimated fetal weight shows growth in the 84th percentile
BPP [DATE]
Recommendations

Recommend repeat scan in 3 weeks if undelivered. Continue
weekly BPP

## 2019-09-30 ENCOUNTER — Ambulatory Visit: Payer: Medicaid Other | Admitting: Obstetrics & Gynecology

## 2019-10-04 IMAGING — US US MFM FETAL BPP W/O NON-STRESS
1 series · 15 of 28 positions shown · non-contrast
Comparison: none

[Series 1: us mfm fetal bpp w/o non-stress · 28 acquisitions, 15 frames shown]
[im 1/28]
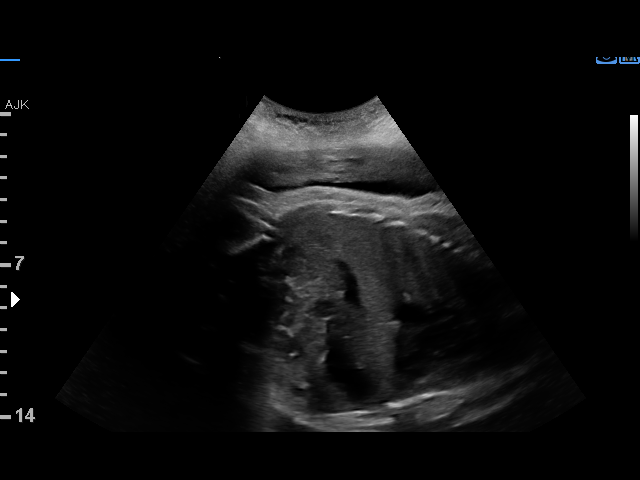
[im 3/28]
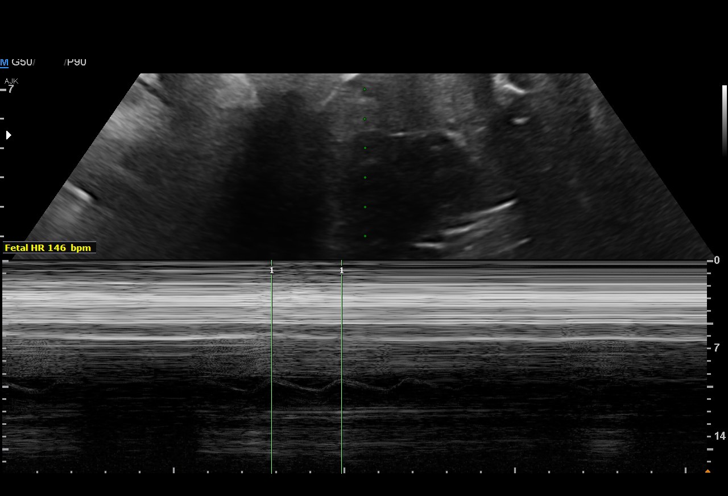
[im 5/28]
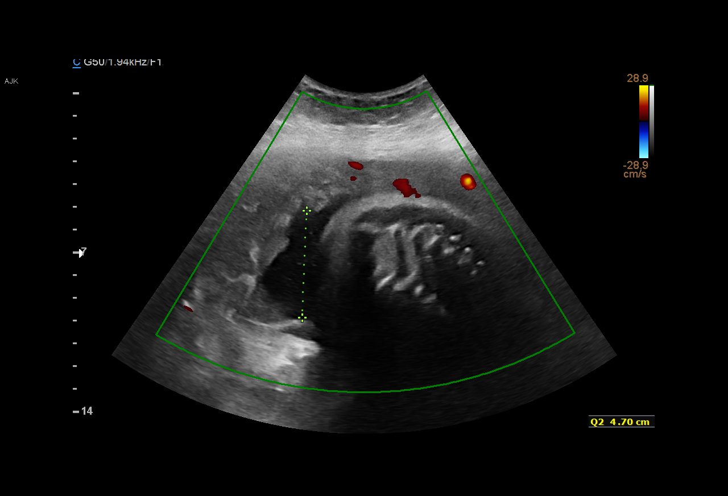
[im 7/28]
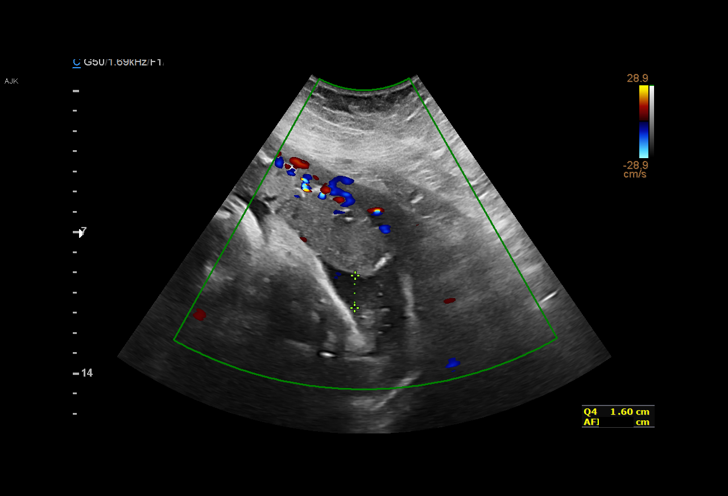
[im 9/28]
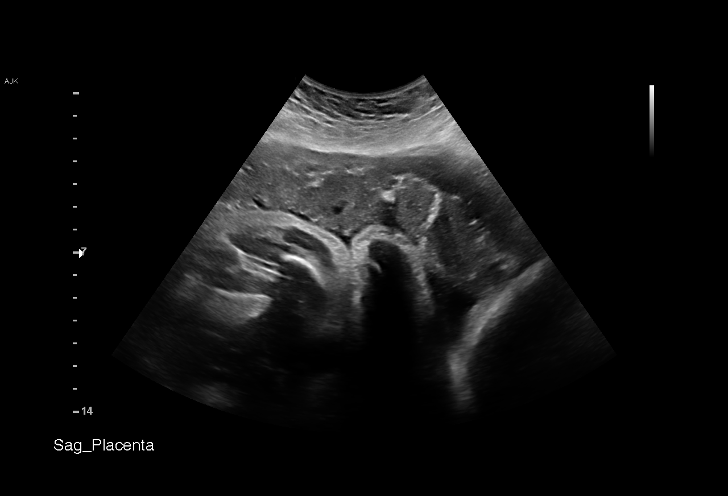
[im 11/28]
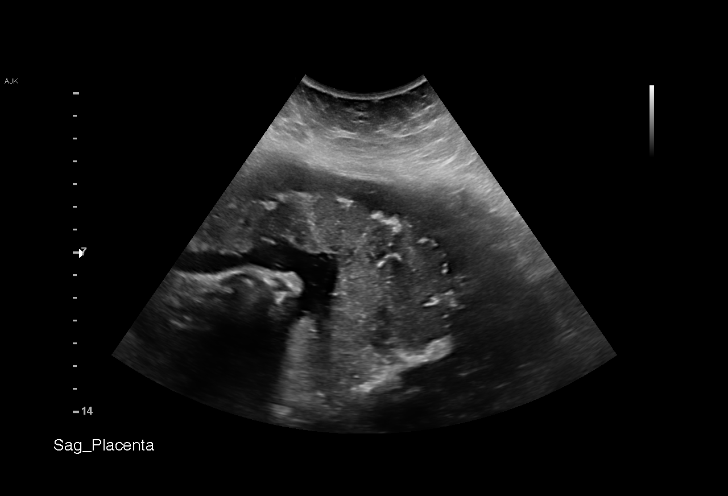
[im 13/28]
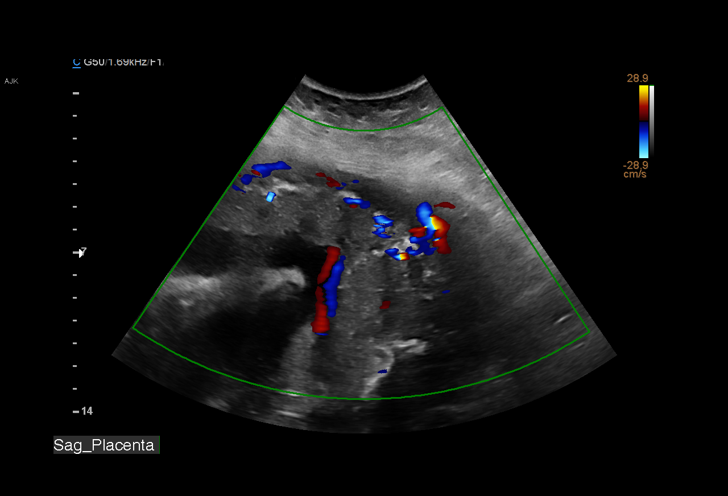
[im 15/28]
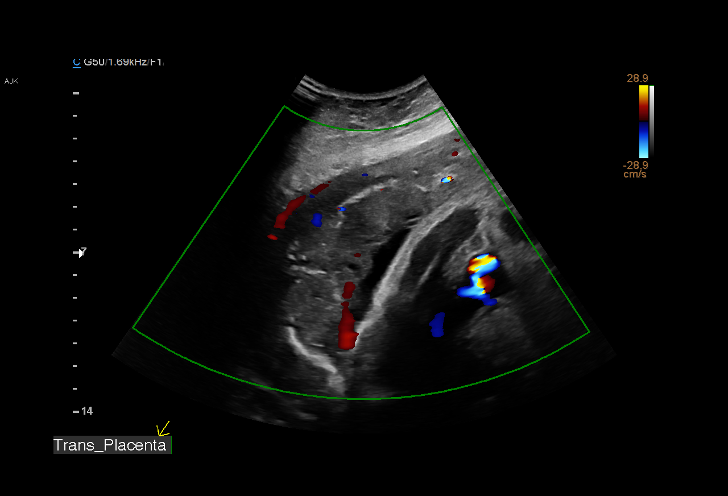
[im 16/28]
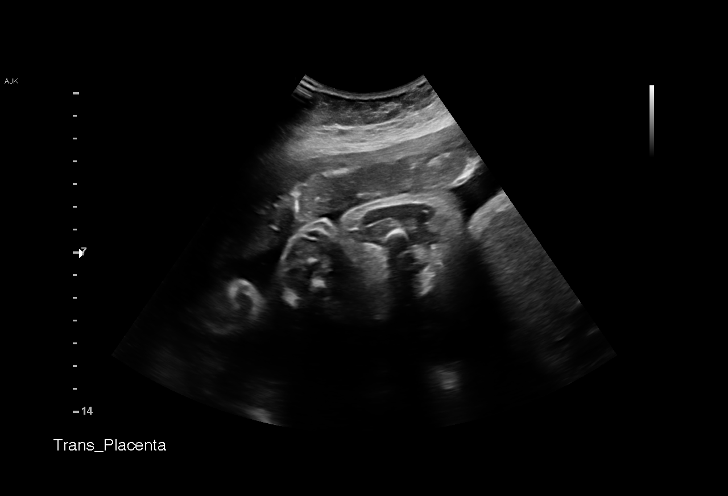
[im 18/28]
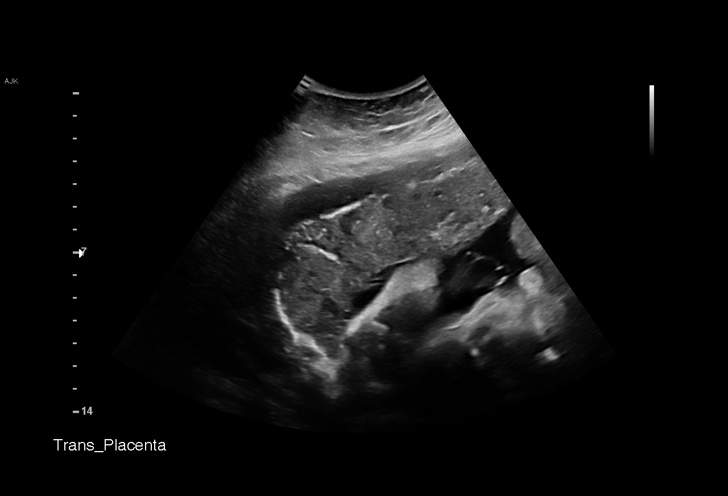
[im 20/28]
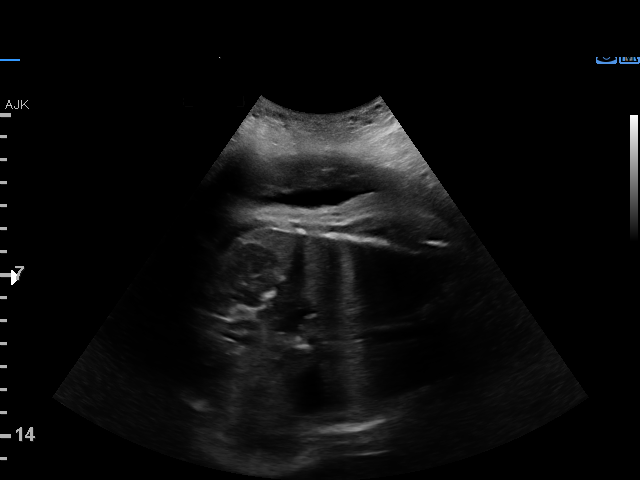
[im 22/28]
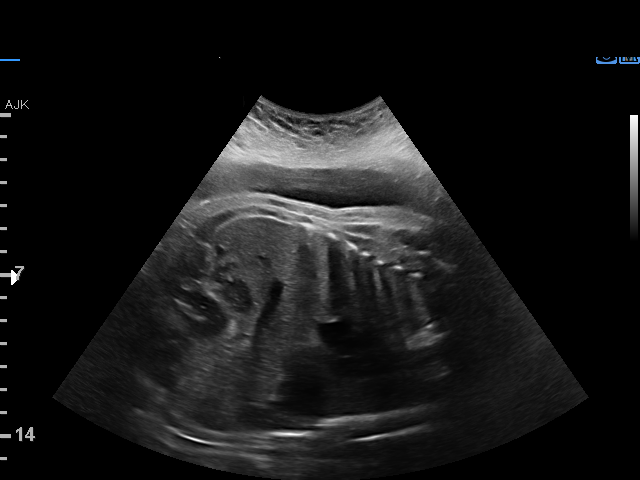
[im 24/28]
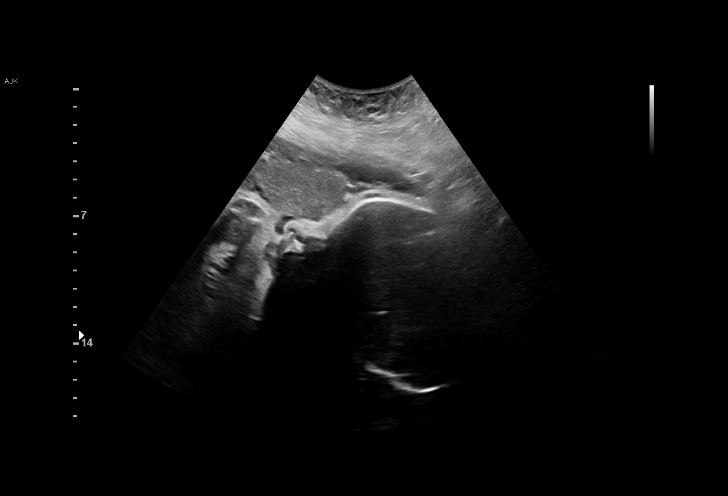
[im 26/28]
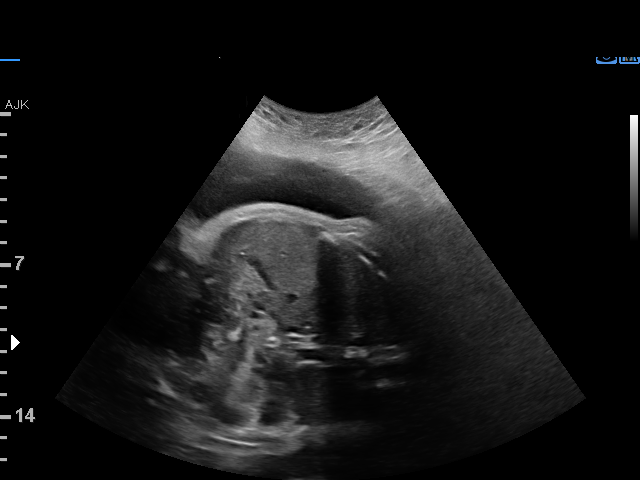
[im 28/28]
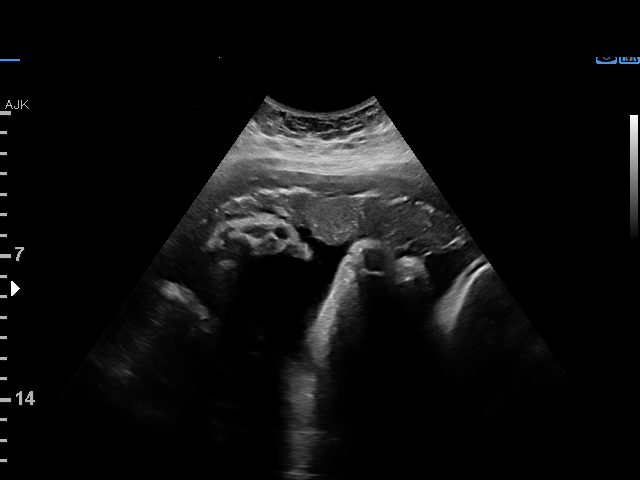

[15 of 28 positions shown; findings below may reference images not displayed]

Road [HOSPITAL]

1  LABELLE SALHA BRACK TIGER           [id number]      [id number]     [id number]
Indications

36 weeks gestation of pregnancy
Hypertension - Chronic/Pre-existing
Gestational diabetes in pregnancy,
controlled by oral hypoglycemic drugs
(metformin)
Maternal morbid obesity
OB History

Blood Type:            Height:  5'6"   Weight (lb):  290       BMI:
Gravidity:    3         Term:   1         SAB:   1
Living:       1
Fetal Evaluation

Num Of Fetuses:     1
Fetal Heart         153
Rate(bpm):
Cardiac Activity:   Observed
Presentation:       Cephalic
Placenta:           Anterior, above cervical os
P. Cord Insertion:  Visualized

Amniotic Fluid
AFI FV:      Subjectively within normal limits

AFI Sum(cm)     %Tile       Largest Pocket(cm)
11.87           37

RUQ(cm)       RLQ(cm)       LUQ(cm)        LLQ(cm)
2.22
Biophysical Evaluation

Amniotic F.V:   Within normal limits       F. Tone:        Observed
F. Movement:    Observed                   Score:          [DATE]
F. Breathing:   Observed
Gestational Age

LMP:           36w 5d        Date:  03/02/17                 EDD:   12/07/17
Best:          36w 5d     Det. By:  LMP  (03/02/17)          EDD:   12/07/17
Impression

Intrauterine pregnancy at 36+5 weeks with CHTN and GDM
Normal amniotic fluid
BPP [DATE]
Recommendations

Recommend repeat scan in 2 weeks if undelivered. Continue
weekly BPP
